# Patient Record
Sex: Female | Born: 1955 | Race: Black or African American | Hispanic: No | Marital: Single | Smoking: Current some day smoker
Health system: Southern US, Community
[De-identification: ages and names within clinical notes are randomized; demographics above are authoritative.]

## PROBLEM LIST (undated history)

## (undated) DIAGNOSIS — F419 Anxiety disorder, unspecified: Secondary | ICD-10-CM

## (undated) DIAGNOSIS — I10 Essential (primary) hypertension: Secondary | ICD-10-CM

## (undated) HISTORY — PX: CORONARY ANGIOPLASTY WITH STENT PLACEMENT: SHX49

## (undated) HISTORY — PX: BREAST CYST EXCISION: SHX579

## (undated) HISTORY — PX: ABDOMINAL HYSTERECTOMY: SHX81

---

## 2010-06-17 ENCOUNTER — Inpatient Hospital Stay (HOSPITAL_COMMUNITY)
Admission: AD | Admit: 2010-06-17 | Discharge: 2010-06-17 | Disposition: A | Discharge status: Home / Self Care | Payer: Medicare Other | Source: Ambulatory Visit | Attending: Obstetrics & Gynecology | Admitting: Obstetrics & Gynecology

## 2010-06-17 ENCOUNTER — Emergency Department (HOSPITAL_COMMUNITY)
Admission: EM | Admit: 2010-06-17 | Discharge: 2010-06-17 | Disposition: A | Discharge status: Home / Self Care | Payer: Medicare Other | Attending: Emergency Medicine | Admitting: Emergency Medicine

## 2010-06-17 ENCOUNTER — Emergency Department (HOSPITAL_COMMUNITY): Payer: Medicare Other

## 2010-06-17 DIAGNOSIS — R05 Cough: Secondary | ICD-10-CM | POA: Insufficient documentation

## 2010-06-17 DIAGNOSIS — R0789 Other chest pain: Secondary | ICD-10-CM | POA: Insufficient documentation

## 2010-06-17 DIAGNOSIS — R079 Chest pain, unspecified: Secondary | ICD-10-CM | POA: Insufficient documentation

## 2010-06-17 DIAGNOSIS — J45909 Unspecified asthma, uncomplicated: Secondary | ICD-10-CM | POA: Insufficient documentation

## 2010-06-17 DIAGNOSIS — E119 Type 2 diabetes mellitus without complications: Secondary | ICD-10-CM | POA: Insufficient documentation

## 2010-06-17 DIAGNOSIS — R059 Cough, unspecified: Secondary | ICD-10-CM | POA: Insufficient documentation

## 2010-06-17 DIAGNOSIS — I10 Essential (primary) hypertension: Secondary | ICD-10-CM | POA: Insufficient documentation

## 2010-06-17 LAB — DIFFERENTIAL
Basophils Absolute: 0 10*3/uL (ref 0.0–0.1)
Basophils Relative: 0 % (ref 0–1)
Lymphocytes Relative: 42 % (ref 12–46)
Monocytes Absolute: 0.3 10*3/uL (ref 0.1–1.0)
Neutro Abs: 2.4 10*3/uL (ref 1.7–7.7)
Neutrophils Relative %: 51 % (ref 43–77)

## 2010-06-17 LAB — COMPREHENSIVE METABOLIC PANEL
ALT: 14 U/L (ref 0–35)
AST: 14 U/L (ref 0–37)
Albumin: 3.5 g/dL (ref 3.5–5.2)
CO2: 27 mEq/L (ref 19–32)
Calcium: 8.8 mg/dL (ref 8.4–10.5)
Creatinine, Ser: 0.89 mg/dL (ref 0.4–1.2)
GFR calc Af Amer: >60 mL/min (ref >60)
GFR calc non Af Amer: >60 mL/min (ref >60)
Sodium: 139 mEq/L (ref 135–145)
Total Protein: 7.1 g/dL (ref 6.0–8.3)

## 2010-06-17 LAB — POCT CARDIAC MARKERS
CKMB, poc: <1 ng/mL — ABNORMAL LOW (ref 1.0–8.0)
Myoglobin, poc: 82.8 ng/mL (ref 12–200)
Troponin i, poc: <0.05 ng/mL (ref 0.00–0.09)

## 2010-06-17 LAB — CBC
HCT: 38.2 % (ref 36.0–46.0)
Hemoglobin: 12.6 g/dL (ref 12.0–15.0)
MCHC: 33 g/dL (ref 30.0–36.0)
RBC: 4.74 MIL/uL (ref 3.87–5.11)
WBC: 4.7 10*3/uL (ref 4.0–10.5)

## 2010-06-19 ENCOUNTER — Emergency Department (HOSPITAL_COMMUNITY)
Admission: EM | Admit: 2010-06-19 | Discharge: 2010-06-20 | Disposition: A | Discharge status: Home / Self Care | Payer: Medicare Other | Attending: Emergency Medicine | Admitting: Emergency Medicine

## 2010-06-19 ENCOUNTER — Emergency Department (HOSPITAL_COMMUNITY): Payer: Medicare Other

## 2010-06-19 DIAGNOSIS — R0989 Other specified symptoms and signs involving the circulatory and respiratory systems: Secondary | ICD-10-CM | POA: Insufficient documentation

## 2010-06-19 DIAGNOSIS — R0609 Other forms of dyspnea: Secondary | ICD-10-CM | POA: Insufficient documentation

## 2010-06-19 DIAGNOSIS — E119 Type 2 diabetes mellitus without complications: Secondary | ICD-10-CM | POA: Insufficient documentation

## 2010-06-19 DIAGNOSIS — R079 Chest pain, unspecified: Secondary | ICD-10-CM | POA: Insufficient documentation

## 2010-06-19 DIAGNOSIS — R110 Nausea: Secondary | ICD-10-CM | POA: Insufficient documentation

## 2010-06-19 DIAGNOSIS — R0602 Shortness of breath: Secondary | ICD-10-CM | POA: Insufficient documentation

## 2010-06-19 DIAGNOSIS — I10 Essential (primary) hypertension: Secondary | ICD-10-CM | POA: Insufficient documentation

## 2010-06-19 DIAGNOSIS — J45909 Unspecified asthma, uncomplicated: Secondary | ICD-10-CM | POA: Insufficient documentation

## 2010-06-19 LAB — DIFFERENTIAL
Basophils Absolute: 0 10*3/uL (ref 0.0–0.1)
Eosinophils Relative: 1 % (ref 0–5)
Lymphocytes Relative: 48 % — ABNORMAL HIGH (ref 12–46)
Lymphs Abs: 2.9 10*3/uL (ref 0.7–4.0)
Neutrophils Relative %: 45 % (ref 43–77)

## 2010-06-19 LAB — CBC
HCT: 37.5 % (ref 36.0–46.0)
Platelets: 257 10*3/uL (ref 150–400)
RBC: 4.68 MIL/uL (ref 3.87–5.11)
RDW: 13.5 % (ref 11.5–15.5)
WBC: 6.1 10*3/uL (ref 4.0–10.5)

## 2010-06-19 LAB — POCT CARDIAC MARKERS
CKMB, poc: <1 ng/mL — ABNORMAL LOW (ref 1.0–8.0)
Troponin i, poc: <0.05 ng/mL (ref 0.00–0.09)

## 2010-06-20 LAB — BASIC METABOLIC PANEL
Chloride: 102 mEq/L (ref 96–112)
GFR calc non Af Amer: >60 mL/min (ref >60)
Glucose, Bld: 256 mg/dL — ABNORMAL HIGH (ref 70–99)
Potassium: 3.4 mEq/L — ABNORMAL LOW (ref 3.5–5.1)
Sodium: 136 mEq/L (ref 135–145)

## 2010-06-20 LAB — D-DIMER, QUANTITATIVE: D-Dimer, Quant: 0.48 ug/mL-FEU (ref 0.00–0.48)

## 2010-06-21 ENCOUNTER — Emergency Department (HOSPITAL_COMMUNITY)
Admission: EM | Admit: 2010-06-21 | Discharge: 2010-06-22 | Disposition: A | Discharge status: Home / Self Care | Payer: Medicare Other | Attending: Emergency Medicine | Admitting: Emergency Medicine

## 2010-06-21 DIAGNOSIS — Z5900 Homelessness unspecified: Secondary | ICD-10-CM | POA: Insufficient documentation

## 2010-06-21 DIAGNOSIS — R079 Chest pain, unspecified: Secondary | ICD-10-CM | POA: Insufficient documentation

## 2010-06-21 DIAGNOSIS — I10 Essential (primary) hypertension: Secondary | ICD-10-CM | POA: Insufficient documentation

## 2010-06-21 DIAGNOSIS — E119 Type 2 diabetes mellitus without complications: Secondary | ICD-10-CM | POA: Insufficient documentation

## 2010-06-21 DIAGNOSIS — J45909 Unspecified asthma, uncomplicated: Secondary | ICD-10-CM | POA: Insufficient documentation

## 2010-06-22 ENCOUNTER — Emergency Department (HOSPITAL_COMMUNITY)
Admission: EM | Admit: 2010-06-22 | Discharge: 2010-06-22 | Disposition: A | Discharge status: Home / Self Care | Payer: Medicare Other | Attending: Emergency Medicine | Admitting: Emergency Medicine

## 2010-06-22 DIAGNOSIS — E119 Type 2 diabetes mellitus without complications: Secondary | ICD-10-CM | POA: Insufficient documentation

## 2010-06-22 DIAGNOSIS — R5381 Other malaise: Secondary | ICD-10-CM | POA: Insufficient documentation

## 2010-06-22 DIAGNOSIS — I10 Essential (primary) hypertension: Secondary | ICD-10-CM | POA: Insufficient documentation

## 2010-06-22 LAB — POCT CARDIAC MARKERS
CKMB, poc: <1 ng/mL — ABNORMAL LOW (ref 1.0–8.0)
CKMB, poc: <1 ng/mL — ABNORMAL LOW (ref 1.0–8.0)
Myoglobin, poc: 61.8 ng/mL (ref 12–200)
Troponin i, poc: <0.05 ng/mL (ref 0.00–0.09)
Troponin i, poc: <0.05 ng/mL (ref 0.00–0.09)

## 2010-06-22 LAB — URINALYSIS, ROUTINE W REFLEX MICROSCOPIC
Leukocytes, UA: NEGATIVE
Nitrite: NEGATIVE
Specific Gravity, Urine: 1.035 — ABNORMAL HIGH (ref 1.005–1.030)
Urobilinogen, UA: 0.2 mg/dL (ref 0.0–1.0)
pH: 5.5 (ref 5.0–8.0)

## 2010-06-22 LAB — BASIC METABOLIC PANEL
BUN: 7 mg/dL (ref 6–23)
GFR calc Af Amer: >60 mL/min (ref >60)
GFR calc non Af Amer: >60 mL/min (ref >60)
Potassium: 3.3 mEq/L — ABNORMAL LOW (ref 3.5–5.1)
Sodium: 139 mEq/L (ref 135–145)

## 2010-06-22 LAB — URINE MICROSCOPIC-ADD ON

## 2010-06-28 ENCOUNTER — Emergency Department (HOSPITAL_COMMUNITY)
Admission: EM | Admit: 2010-06-28 | Discharge: 2010-06-28 | Disposition: A | Discharge status: Home / Self Care | Payer: Medicare Other | Attending: Emergency Medicine | Admitting: Emergency Medicine

## 2010-06-28 DIAGNOSIS — I10 Essential (primary) hypertension: Secondary | ICD-10-CM | POA: Insufficient documentation

## 2010-06-28 DIAGNOSIS — K029 Dental caries, unspecified: Secondary | ICD-10-CM | POA: Insufficient documentation

## 2010-06-28 DIAGNOSIS — K089 Disorder of teeth and supporting structures, unspecified: Secondary | ICD-10-CM | POA: Insufficient documentation

## 2010-06-28 DIAGNOSIS — Z794 Long term (current) use of insulin: Secondary | ICD-10-CM | POA: Insufficient documentation

## 2010-06-28 DIAGNOSIS — E119 Type 2 diabetes mellitus without complications: Secondary | ICD-10-CM | POA: Insufficient documentation

## 2010-06-29 ENCOUNTER — Emergency Department (HOSPITAL_COMMUNITY): Payer: Medicare Other

## 2010-06-29 ENCOUNTER — Emergency Department (HOSPITAL_COMMUNITY)
Admission: EM | Admit: 2010-06-29 | Discharge: 2010-06-30 | Disposition: A | Discharge status: Home / Self Care | Payer: Medicare Other | Attending: Emergency Medicine | Admitting: Emergency Medicine

## 2010-06-29 DIAGNOSIS — Z794 Long term (current) use of insulin: Secondary | ICD-10-CM | POA: Insufficient documentation

## 2010-06-29 DIAGNOSIS — E119 Type 2 diabetes mellitus without complications: Secondary | ICD-10-CM | POA: Insufficient documentation

## 2010-06-29 DIAGNOSIS — I10 Essential (primary) hypertension: Secondary | ICD-10-CM | POA: Insufficient documentation

## 2010-06-29 DIAGNOSIS — J45909 Unspecified asthma, uncomplicated: Secondary | ICD-10-CM | POA: Insufficient documentation

## 2010-06-29 DIAGNOSIS — F17200 Nicotine dependence, unspecified, uncomplicated: Secondary | ICD-10-CM | POA: Insufficient documentation

## 2010-06-29 DIAGNOSIS — R071 Chest pain on breathing: Secondary | ICD-10-CM | POA: Insufficient documentation

## 2010-06-29 LAB — CBC
MCV: 81.4 fL (ref 78.0–100.0)
Platelets: 255 10*3/uL (ref 150–400)
RDW: 13.8 % (ref 11.5–15.5)
WBC: 6.1 10*3/uL (ref 4.0–10.5)

## 2010-06-29 LAB — DIFFERENTIAL
Basophils Relative: 0 % (ref 0–1)
Eosinophils Absolute: 0.1 10*3/uL (ref 0.0–0.7)
Eosinophils Relative: 2 % (ref 0–5)
Lymphs Abs: 3 10*3/uL (ref 0.7–4.0)

## 2010-06-30 ENCOUNTER — Inpatient Hospital Stay (HOSPITAL_COMMUNITY)
Admission: AD | Admit: 2010-06-30 | Discharge: 2010-06-30 | Disposition: A | Discharge status: Home / Self Care | Payer: Medicare Other | Source: Ambulatory Visit | Attending: Obstetrics & Gynecology | Admitting: Obstetrics & Gynecology

## 2010-06-30 DIAGNOSIS — N760 Acute vaginitis: Secondary | ICD-10-CM

## 2010-06-30 DIAGNOSIS — N949 Unspecified condition associated with female genital organs and menstrual cycle: Secondary | ICD-10-CM | POA: Insufficient documentation

## 2010-06-30 LAB — URINALYSIS, ROUTINE W REFLEX MICROSCOPIC
Bilirubin Urine: NEGATIVE
Ketones, ur: NEGATIVE mg/dL
Nitrite: NEGATIVE
Protein, ur: 100 mg/dL — AB
Urobilinogen, UA: 0.2 mg/dL (ref 0.0–1.0)

## 2010-06-30 LAB — WET PREP, GENITAL
Clue Cells Wet Prep HPF POC: NONE SEEN
Yeast Wet Prep HPF POC: NONE SEEN

## 2010-06-30 LAB — BASIC METABOLIC PANEL
BUN: 12 mg/dL (ref 6–23)
Chloride: 107 mEq/L (ref 96–112)
GFR calc non Af Amer: >60 mL/min (ref >60)
Glucose, Bld: 221 mg/dL — ABNORMAL HIGH (ref 70–99)
Potassium: 3.8 mEq/L (ref 3.5–5.1)

## 2010-06-30 LAB — POCT CARDIAC MARKERS
CKMB, poc: <1 ng/mL — ABNORMAL LOW (ref 1.0–8.0)
Troponin i, poc: <0.05 ng/mL (ref 0.00–0.09)

## 2010-07-01 ENCOUNTER — Inpatient Hospital Stay (HOSPITAL_COMMUNITY)
Admission: AD | Admit: 2010-07-01 | Discharge: 2010-07-02 | Disposition: A | Discharge status: Home / Self Care | Payer: Medicare Other | Source: Ambulatory Visit | Attending: Obstetrics & Gynecology | Admitting: Obstetrics & Gynecology

## 2010-07-01 DIAGNOSIS — H538 Other visual disturbances: Secondary | ICD-10-CM | POA: Insufficient documentation

## 2010-07-04 ENCOUNTER — Inpatient Hospital Stay (HOSPITAL_COMMUNITY)
Admission: AD | Admit: 2010-07-04 | Discharge: 2010-07-04 | Disposition: A | Discharge status: Home / Self Care | Payer: Medicare Other | Source: Ambulatory Visit | Attending: Obstetrics & Gynecology | Admitting: Obstetrics & Gynecology

## 2010-07-04 DIAGNOSIS — I10 Essential (primary) hypertension: Secondary | ICD-10-CM | POA: Insufficient documentation

## 2010-07-04 DIAGNOSIS — R109 Unspecified abdominal pain: Secondary | ICD-10-CM

## 2010-07-04 LAB — URINALYSIS, ROUTINE W REFLEX MICROSCOPIC
Bilirubin Urine: NEGATIVE
Glucose, UA: NEGATIVE mg/dL
Ketones, ur: NEGATIVE mg/dL
Leukocytes, UA: NEGATIVE
Nitrite: NEGATIVE
Protein, ur: 100 mg/dL — AB
Specific Gravity, Urine: >1.03 — ABNORMAL HIGH (ref 1.005–1.030)
Urobilinogen, UA: 0.2 mg/dL (ref 0.0–1.0)
pH: 6 (ref 5.0–8.0)

## 2010-07-04 LAB — URINE MICROSCOPIC-ADD ON

## 2010-07-05 ENCOUNTER — Emergency Department (HOSPITAL_BASED_OUTPATIENT_CLINIC_OR_DEPARTMENT_OTHER)
Admission: EM | Admit: 2010-07-05 | Discharge: 2010-07-05 | Disposition: A | Discharge status: Home / Self Care | Payer: Medicare Other | Attending: Emergency Medicine | Admitting: Emergency Medicine

## 2010-07-05 DIAGNOSIS — R109 Unspecified abdominal pain: Secondary | ICD-10-CM | POA: Insufficient documentation

## 2010-07-05 DIAGNOSIS — N200 Calculus of kidney: Secondary | ICD-10-CM | POA: Insufficient documentation

## 2010-07-06 ENCOUNTER — Emergency Department (HOSPITAL_COMMUNITY)
Admission: EM | Admit: 2010-07-06 | Discharge: 2010-07-06 | Disposition: A | Discharge status: Home / Self Care | Payer: Medicare Other | Attending: Emergency Medicine | Admitting: Emergency Medicine

## 2010-07-06 DIAGNOSIS — M2550 Pain in unspecified joint: Secondary | ICD-10-CM | POA: Insufficient documentation

## 2010-07-06 DIAGNOSIS — R112 Nausea with vomiting, unspecified: Secondary | ICD-10-CM | POA: Insufficient documentation

## 2010-07-06 DIAGNOSIS — R109 Unspecified abdominal pain: Secondary | ICD-10-CM | POA: Insufficient documentation

## 2010-07-06 DIAGNOSIS — Z91199 Patient's noncompliance with other medical treatment and regimen due to unspecified reason: Secondary | ICD-10-CM | POA: Insufficient documentation

## 2010-07-06 DIAGNOSIS — J45909 Unspecified asthma, uncomplicated: Secondary | ICD-10-CM | POA: Insufficient documentation

## 2010-07-06 DIAGNOSIS — IMO0001 Reserved for inherently not codable concepts without codable children: Secondary | ICD-10-CM | POA: Insufficient documentation

## 2010-07-06 DIAGNOSIS — Z9119 Patient's noncompliance with other medical treatment and regimen: Secondary | ICD-10-CM | POA: Insufficient documentation

## 2010-07-06 DIAGNOSIS — E119 Type 2 diabetes mellitus without complications: Secondary | ICD-10-CM | POA: Insufficient documentation

## 2010-07-06 DIAGNOSIS — I10 Essential (primary) hypertension: Secondary | ICD-10-CM | POA: Insufficient documentation

## 2010-07-06 DIAGNOSIS — E039 Hypothyroidism, unspecified: Secondary | ICD-10-CM | POA: Insufficient documentation

## 2010-07-06 LAB — URINALYSIS, ROUTINE W REFLEX MICROSCOPIC
Hgb urine dipstick: NEGATIVE
Leukocytes, UA: NEGATIVE
Nitrite: NEGATIVE
Specific Gravity, Urine: 1.027 (ref 1.005–1.030)
Urobilinogen, UA: 1 mg/dL (ref 0.0–1.0)

## 2010-07-06 LAB — POCT I-STAT, CHEM 8
Creatinine, Ser: 0.9 mg/dL (ref 0.4–1.2)
HCT: 34 % — ABNORMAL LOW (ref 36.0–46.0)
Hemoglobin: 11.6 g/dL — ABNORMAL LOW (ref 12.0–15.0)
Potassium: 3.9 mEq/L (ref 3.5–5.1)
Sodium: 140 mEq/L (ref 135–145)

## 2010-07-06 LAB — URINE MICROSCOPIC-ADD ON

## 2010-08-26 ENCOUNTER — Emergency Department (HOSPITAL_COMMUNITY)
Admission: EM | Admit: 2010-08-26 | Discharge: 2010-08-27 | Disposition: A | Discharge status: Home / Self Care | Payer: Medicare Other | Attending: Emergency Medicine | Admitting: Emergency Medicine

## 2010-08-26 DIAGNOSIS — R0602 Shortness of breath: Secondary | ICD-10-CM | POA: Insufficient documentation

## 2010-08-26 DIAGNOSIS — R609 Edema, unspecified: Secondary | ICD-10-CM | POA: Insufficient documentation

## 2010-08-26 DIAGNOSIS — R0789 Other chest pain: Secondary | ICD-10-CM | POA: Insufficient documentation

## 2010-08-26 DIAGNOSIS — Z794 Long term (current) use of insulin: Secondary | ICD-10-CM | POA: Insufficient documentation

## 2010-08-26 DIAGNOSIS — Z5900 Homelessness unspecified: Secondary | ICD-10-CM | POA: Insufficient documentation

## 2010-08-26 DIAGNOSIS — E119 Type 2 diabetes mellitus without complications: Secondary | ICD-10-CM | POA: Insufficient documentation

## 2010-08-26 DIAGNOSIS — R0609 Other forms of dyspnea: Secondary | ICD-10-CM | POA: Insufficient documentation

## 2010-08-26 DIAGNOSIS — R0989 Other specified symptoms and signs involving the circulatory and respiratory systems: Secondary | ICD-10-CM | POA: Insufficient documentation

## 2010-08-26 DIAGNOSIS — I10 Essential (primary) hypertension: Secondary | ICD-10-CM | POA: Insufficient documentation

## 2010-08-27 ENCOUNTER — Emergency Department (HOSPITAL_COMMUNITY): Payer: Medicare Other

## 2010-08-27 LAB — URINALYSIS, ROUTINE W REFLEX MICROSCOPIC
Bilirubin Urine: NEGATIVE
Ketones, ur: NEGATIVE mg/dL
Protein, ur: >300 mg/dL — AB
Urobilinogen, UA: 1 mg/dL (ref 0.0–1.0)

## 2010-08-27 LAB — URINE MICROSCOPIC-ADD ON

## 2010-08-27 LAB — DIFFERENTIAL
Basophils Absolute: 0 10*3/uL (ref 0.0–0.1)
Basophils Relative: 0 % (ref 0–1)
Eosinophils Absolute: 0.1 10*3/uL (ref 0.0–0.7)
Eosinophils Relative: 1 % (ref 0–5)
Lymphs Abs: 2.7 10*3/uL (ref 0.7–4.0)
Neutrophils Relative %: 55 % (ref 43–77)

## 2010-08-27 LAB — COMPREHENSIVE METABOLIC PANEL
AST: 16 U/L (ref 0–37)
Albumin: 3.3 g/dL — ABNORMAL LOW (ref 3.5–5.2)
Alkaline Phosphatase: 139 U/L — ABNORMAL HIGH (ref 39–117)
BUN: 12 mg/dL (ref 6–23)
GFR calc Af Amer: >60 mL/min (ref >60)
Potassium: 3.8 mEq/L (ref 3.5–5.1)
Total Protein: 7.3 g/dL (ref 6.0–8.3)

## 2010-08-27 LAB — CBC
MCV: 79.5 fL (ref 78.0–100.0)
Platelets: 242 10*3/uL (ref 150–400)
RDW: 13.5 % (ref 11.5–15.5)
WBC: 7 10*3/uL (ref 4.0–10.5)

## 2010-08-27 LAB — GLUCOSE, CAPILLARY: Glucose-Capillary: 203 mg/dL — ABNORMAL HIGH (ref 70–99)

## 2010-08-28 ENCOUNTER — Emergency Department (HOSPITAL_BASED_OUTPATIENT_CLINIC_OR_DEPARTMENT_OTHER)
Admission: EM | Admit: 2010-08-28 | Discharge: 2010-08-28 | Disposition: A | Discharge status: Home / Self Care | Payer: Medicare Other | Attending: Emergency Medicine | Admitting: Emergency Medicine

## 2010-08-28 DIAGNOSIS — R059 Cough, unspecified: Secondary | ICD-10-CM | POA: Insufficient documentation

## 2010-08-28 DIAGNOSIS — F17200 Nicotine dependence, unspecified, uncomplicated: Secondary | ICD-10-CM | POA: Insufficient documentation

## 2010-08-28 DIAGNOSIS — R05 Cough: Secondary | ICD-10-CM | POA: Insufficient documentation

## 2010-08-28 DIAGNOSIS — I10 Essential (primary) hypertension: Secondary | ICD-10-CM | POA: Insufficient documentation

## 2010-08-28 DIAGNOSIS — E119 Type 2 diabetes mellitus without complications: Secondary | ICD-10-CM | POA: Insufficient documentation

## 2010-08-28 DIAGNOSIS — J45909 Unspecified asthma, uncomplicated: Secondary | ICD-10-CM | POA: Insufficient documentation

## 2010-08-28 DIAGNOSIS — E039 Hypothyroidism, unspecified: Secondary | ICD-10-CM | POA: Insufficient documentation

## 2010-09-02 ENCOUNTER — Emergency Department (INDEPENDENT_AMBULATORY_CARE_PROVIDER_SITE_OTHER): Payer: Medicare Other

## 2010-09-02 ENCOUNTER — Emergency Department (HOSPITAL_BASED_OUTPATIENT_CLINIC_OR_DEPARTMENT_OTHER)
Admission: EM | Admit: 2010-09-02 | Discharge: 2010-09-03 | Disposition: A | Discharge status: Home / Self Care | Payer: Medicare Other | Attending: Emergency Medicine | Admitting: Emergency Medicine

## 2010-09-02 DIAGNOSIS — I10 Essential (primary) hypertension: Secondary | ICD-10-CM | POA: Insufficient documentation

## 2010-09-02 DIAGNOSIS — M7989 Other specified soft tissue disorders: Secondary | ICD-10-CM

## 2010-09-02 DIAGNOSIS — J45909 Unspecified asthma, uncomplicated: Secondary | ICD-10-CM | POA: Insufficient documentation

## 2010-09-02 DIAGNOSIS — E1169 Type 2 diabetes mellitus with other specified complication: Secondary | ICD-10-CM | POA: Insufficient documentation

## 2010-09-02 DIAGNOSIS — E039 Hypothyroidism, unspecified: Secondary | ICD-10-CM | POA: Insufficient documentation

## 2010-09-02 DIAGNOSIS — F17200 Nicotine dependence, unspecified, uncomplicated: Secondary | ICD-10-CM | POA: Insufficient documentation

## 2010-09-02 DIAGNOSIS — R079 Chest pain, unspecified: Secondary | ICD-10-CM

## 2010-09-02 DIAGNOSIS — Z79899 Other long term (current) drug therapy: Secondary | ICD-10-CM | POA: Insufficient documentation

## 2010-09-02 LAB — URINALYSIS, ROUTINE W REFLEX MICROSCOPIC
Bilirubin Urine: NEGATIVE
Ketones, ur: NEGATIVE mg/dL
Protein, ur: >300 mg/dL — AB
Urobilinogen, UA: 1 mg/dL (ref 0.0–1.0)

## 2010-09-02 LAB — GLUCOSE, CAPILLARY
Glucose-Capillary: 288 mg/dL — ABNORMAL HIGH (ref 70–99)
Glucose-Capillary: 312 mg/dL — ABNORMAL HIGH (ref 70–99)

## 2010-09-02 LAB — URINE MICROSCOPIC-ADD ON

## 2010-09-02 LAB — BASIC METABOLIC PANEL
Calcium: 9.6 mg/dL (ref 8.4–10.5)
Creatinine, Ser: 0.8 mg/dL (ref 0.4–1.2)
GFR calc Af Amer: >60 mL/min (ref >60)

## 2010-09-03 ENCOUNTER — Inpatient Hospital Stay (HOSPITAL_COMMUNITY)
Admission: EM | Admit: 2010-09-03 | Discharge: 2010-09-03 | Disposition: A | Discharge status: Home / Self Care | Payer: Medicare Other | Source: Ambulatory Visit | Attending: Family Medicine | Admitting: Family Medicine

## 2010-09-03 DIAGNOSIS — B373 Candidiasis of vulva and vagina: Secondary | ICD-10-CM

## 2010-09-03 DIAGNOSIS — B3731 Acute candidiasis of vulva and vagina: Secondary | ICD-10-CM | POA: Insufficient documentation

## 2010-09-03 DIAGNOSIS — N949 Unspecified condition associated with female genital organs and menstrual cycle: Secondary | ICD-10-CM

## 2010-09-03 LAB — CBC
Hemoglobin: 12.1 g/dL (ref 12.0–15.0)
MCV: 79.7 fL (ref 78.0–100.0)
Platelets: 259 10*3/uL (ref 150–400)
RBC: 4.53 MIL/uL (ref 3.87–5.11)
WBC: 6.1 10*3/uL (ref 4.0–10.5)

## 2010-09-03 LAB — URINALYSIS, ROUTINE W REFLEX MICROSCOPIC
Bilirubin Urine: NEGATIVE
Ketones, ur: NEGATIVE mg/dL
Nitrite: NEGATIVE
Protein, ur: 100 mg/dL — AB
Urobilinogen, UA: 1 mg/dL (ref 0.0–1.0)
pH: 6 (ref 5.0–8.0)

## 2010-09-03 LAB — GLUCOSE, CAPILLARY: Glucose-Capillary: 189 mg/dL — ABNORMAL HIGH (ref 70–99)

## 2010-09-03 LAB — URINE MICROSCOPIC-ADD ON

## 2010-09-03 LAB — WET PREP, GENITAL

## 2010-10-24 ENCOUNTER — Emergency Department (HOSPITAL_COMMUNITY)
Admission: EM | Admit: 2010-10-24 | Discharge: 2010-10-24 | Disposition: A | Discharge status: Home / Self Care | Payer: Medicare Other | Attending: Emergency Medicine | Admitting: Emergency Medicine

## 2010-10-24 DIAGNOSIS — I10 Essential (primary) hypertension: Secondary | ICD-10-CM | POA: Insufficient documentation

## 2010-10-24 DIAGNOSIS — R109 Unspecified abdominal pain: Secondary | ICD-10-CM | POA: Insufficient documentation

## 2010-10-24 DIAGNOSIS — J45909 Unspecified asthma, uncomplicated: Secondary | ICD-10-CM | POA: Insufficient documentation

## 2010-10-24 DIAGNOSIS — E119 Type 2 diabetes mellitus without complications: Secondary | ICD-10-CM | POA: Insufficient documentation

## 2010-10-24 DIAGNOSIS — R110 Nausea: Secondary | ICD-10-CM | POA: Insufficient documentation

## 2010-10-24 DIAGNOSIS — E039 Hypothyroidism, unspecified: Secondary | ICD-10-CM | POA: Insufficient documentation

## 2010-10-29 ENCOUNTER — Emergency Department (HOSPITAL_COMMUNITY)
Admission: EM | Admit: 2010-10-29 | Discharge: 2010-10-29 | Disposition: A | Discharge status: Home / Self Care | Payer: Medicare Other | Attending: Emergency Medicine | Admitting: Emergency Medicine

## 2010-10-29 DIAGNOSIS — E039 Hypothyroidism, unspecified: Secondary | ICD-10-CM | POA: Insufficient documentation

## 2010-10-29 DIAGNOSIS — Z794 Long term (current) use of insulin: Secondary | ICD-10-CM | POA: Insufficient documentation

## 2010-10-29 DIAGNOSIS — R1012 Left upper quadrant pain: Secondary | ICD-10-CM | POA: Insufficient documentation

## 2010-10-29 DIAGNOSIS — R112 Nausea with vomiting, unspecified: Secondary | ICD-10-CM | POA: Insufficient documentation

## 2010-10-29 DIAGNOSIS — R197 Diarrhea, unspecified: Secondary | ICD-10-CM | POA: Insufficient documentation

## 2010-10-29 DIAGNOSIS — Z79899 Other long term (current) drug therapy: Secondary | ICD-10-CM | POA: Insufficient documentation

## 2010-10-29 DIAGNOSIS — I10 Essential (primary) hypertension: Secondary | ICD-10-CM | POA: Insufficient documentation

## 2010-10-29 DIAGNOSIS — E119 Type 2 diabetes mellitus without complications: Secondary | ICD-10-CM | POA: Insufficient documentation

## 2010-10-29 DIAGNOSIS — R300 Dysuria: Secondary | ICD-10-CM | POA: Insufficient documentation

## 2010-10-29 DIAGNOSIS — J45909 Unspecified asthma, uncomplicated: Secondary | ICD-10-CM | POA: Insufficient documentation

## 2010-10-29 LAB — POCT I-STAT, CHEM 8
Creatinine, Ser: 1 mg/dL (ref 0.50–1.10)
HCT: 42 % (ref 36.0–46.0)
Hemoglobin: 14.3 g/dL (ref 12.0–15.0)
Potassium: 3.6 mEq/L (ref 3.5–5.1)
Sodium: 144 mEq/L (ref 135–145)

## 2010-10-29 LAB — DIFFERENTIAL
Basophils Relative: 0 % (ref 0–1)
Eosinophils Absolute: 0.1 10*3/uL (ref 0.0–0.7)
Eosinophils Relative: 2 % (ref 0–5)
Monocytes Absolute: 0.3 10*3/uL (ref 0.1–1.0)
Monocytes Relative: 6 % (ref 3–12)

## 2010-10-29 LAB — URINALYSIS, ROUTINE W REFLEX MICROSCOPIC
Nitrite: NEGATIVE
Specific Gravity, Urine: 1.028 (ref 1.005–1.030)
Urobilinogen, UA: 1 mg/dL (ref 0.0–1.0)

## 2010-10-29 LAB — HEPATIC FUNCTION PANEL
Alkaline Phosphatase: 96 U/L (ref 39–117)
Total Protein: 7.8 g/dL (ref 6.0–8.3)

## 2010-10-29 LAB — LIPASE, BLOOD: Lipase: 22 U/L (ref 11–59)

## 2010-10-29 LAB — CBC
MCH: 26.1 pg (ref 26.0–34.0)
MCHC: 32.8 g/dL (ref 30.0–36.0)
Platelets: 304 10*3/uL (ref 150–400)

## 2010-10-29 LAB — URINE MICROSCOPIC-ADD ON

## 2011-04-28 ENCOUNTER — Encounter (HOSPITAL_COMMUNITY): Payer: Self-pay | Admitting: Family Medicine

## 2011-04-28 ENCOUNTER — Emergency Department (HOSPITAL_COMMUNITY): Payer: Medicare Other

## 2011-04-28 ENCOUNTER — Emergency Department (HOSPITAL_COMMUNITY)
Admission: EM | Admit: 2011-04-28 | Discharge: 2011-04-28 | Disposition: A | Discharge status: Home / Self Care | Payer: Medicare Other | Attending: Emergency Medicine | Admitting: Emergency Medicine

## 2011-04-28 ENCOUNTER — Other Ambulatory Visit: Payer: Self-pay

## 2011-04-28 ENCOUNTER — Observation Stay (HOSPITAL_COMMUNITY)
Admission: EM | Admit: 2011-04-28 | Discharge: 2011-04-30 | Disposition: A | Discharge status: Home / Self Care | Payer: Medicare Other | Attending: Internal Medicine | Admitting: Internal Medicine

## 2011-04-28 ENCOUNTER — Encounter (HOSPITAL_COMMUNITY): Payer: Self-pay

## 2011-04-28 DIAGNOSIS — Z79899 Other long term (current) drug therapy: Secondary | ICD-10-CM | POA: Insufficient documentation

## 2011-04-28 DIAGNOSIS — Z794 Long term (current) use of insulin: Secondary | ICD-10-CM | POA: Insufficient documentation

## 2011-04-28 DIAGNOSIS — Z5900 Homelessness unspecified: Secondary | ICD-10-CM

## 2011-04-28 DIAGNOSIS — R0789 Other chest pain: Secondary | ICD-10-CM

## 2011-04-28 DIAGNOSIS — E119 Type 2 diabetes mellitus without complications: Secondary | ICD-10-CM | POA: Diagnosis present

## 2011-04-28 DIAGNOSIS — I10 Essential (primary) hypertension: Secondary | ICD-10-CM | POA: Insufficient documentation

## 2011-04-28 DIAGNOSIS — R071 Chest pain on breathing: Secondary | ICD-10-CM | POA: Insufficient documentation

## 2011-04-28 DIAGNOSIS — J45909 Unspecified asthma, uncomplicated: Secondary | ICD-10-CM | POA: Insufficient documentation

## 2011-04-28 HISTORY — DX: Essential (primary) hypertension: I10

## 2011-04-28 LAB — CBC
HCT: 39.6 % (ref 36.0–46.0)
Hemoglobin: 13.4 g/dL (ref 12.0–15.0)
MCH: 27.3 pg (ref 26.0–34.0)
MCHC: 33.8 g/dL (ref 30.0–36.0)
MCV: 80.7 fL (ref 78.0–100.0)
Platelets: 280 10*3/uL (ref 150–400)
RBC: 4.91 MIL/uL (ref 3.87–5.11)
RDW: 13 % (ref 11.5–15.5)
WBC: 5.3 10*3/uL (ref 4.0–10.5)

## 2011-04-28 LAB — BASIC METABOLIC PANEL
BUN: 11 mg/dL (ref 6–23)
CO2: 26 mEq/L (ref 19–32)
Calcium: 9 mg/dL (ref 8.4–10.5)
Chloride: 100 mEq/L (ref 96–112)
Creatinine, Ser: 0.71 mg/dL (ref 0.50–1.10)
GFR calc Af Amer: >90 mL/min (ref >90)
GFR calc non Af Amer: >90 mL/min (ref >90)
Glucose, Bld: 260 mg/dL — ABNORMAL HIGH (ref 70–99)
Potassium: 3.4 mEq/L — ABNORMAL LOW (ref 3.5–5.1)
Sodium: 136 mEq/L (ref 135–145)

## 2011-04-28 LAB — D-DIMER, QUANTITATIVE (NOT AT ARMC): D-Dimer, Quant: 0.25 ug/mL-FEU (ref 0.00–0.48)

## 2011-04-28 LAB — CARDIAC PANEL(CRET KIN+CKTOT+MB+TROPI)
CK, MB: 2.5 ng/mL (ref 0.3–4.0)
Relative Index: 1.5 (ref 0.0–2.5)
Total CK: 170 U/L (ref 7–177)
Troponin I: <0.3 ng/mL (ref <0.30)

## 2011-04-28 LAB — TROPONIN I: Troponin I: <0.3 ng/mL (ref <0.30)

## 2011-04-28 LAB — POCT I-STAT, CHEM 8
Chloride: 105 mEq/L (ref 96–112)
Creatinine, Ser: 1 mg/dL (ref 0.50–1.10)
Glucose, Bld: 308 mg/dL — ABNORMAL HIGH (ref 70–99)
Potassium: 3.7 mEq/L (ref 3.5–5.1)
Sodium: 139 mEq/L (ref 135–145)

## 2011-04-28 MED ORDER — DIAZEPAM 5 MG PO TABS
5.0000 mg | ORAL_TABLET | Freq: Once | ORAL | Status: AC
  Administered 2011-04-28: 5 mg via ORAL
  Filled 2011-04-28: qty 1

## 2011-04-28 MED ORDER — HYDROCODONE-ACETAMINOPHEN 5-325 MG PO TABS: 1.0000 | ORAL_TABLET | Freq: Four times a day (QID) | ORAL | Status: DC | PRN

## 2011-04-28 MED ORDER — KETOROLAC TROMETHAMINE 60 MG/2ML IM SOLN
60.0000 mg | Freq: Once | INTRAMUSCULAR | Status: AC
  Administered 2011-04-28: 60 mg via INTRAMUSCULAR
  Filled 2011-04-28: qty 2

## 2011-04-28 MED ORDER — IBUPROFEN 800 MG PO TABS: 800.0000 mg | ORAL_TABLET | Freq: Three times a day (TID) | ORAL | Status: DC | PRN

## 2011-04-28 MED ORDER — OXYCODONE-ACETAMINOPHEN 5-325 MG PO TABS
1.0000 | ORAL_TABLET | Freq: Once | ORAL | Status: AC
  Administered 2011-04-28: 1 via ORAL
  Filled 2011-04-28: qty 1

## 2011-04-28 NOTE — ED Provider Notes (Signed)
History     CSN: [id number]  Arrival date & time 04/28/11  1243   First MD Initiated Contact with Patient 04/28/11 1252      Chief Complaint  Patient presents with  . Chest Pain    (Consider location/radiation/quality/duration/timing/severity/associated sxs/prior treatment) HPI Patient presents to emergency room with chest pain that started today around 11:00 AM.  She states it has been constant and nonradiating.  She states it is sharp in nature and hurts to press or move.  She denies abdominal pain, nausea/vomiting, weakness, visual changes, headache,sob, or back pain.  Patient's pain is worse with movement.  Past Medical History  Diagnosis Date  . Asthma   . Hypertension   . Diabetes mellitus     Past Surgical History  Procedure Date  . Abdominal hysterectomy   . Breast cyst excision   . Coronary angioplasty with stent placement     No family history on file.  History  Substance Use Topics  . Smoking status: Passive Smoker  . Smokeless tobacco: Not on file  . Alcohol Use: Yes     beer sometimes    OB History    Grav Para Term Preterm Abortions TAB SAB Ect Mult Living                  Review of Systems All pertinent positives and negatives reviewed in the history of present illness  Allergies  Codeine  Home Medications   Current Outpatient Rx  Name Route Sig Dispense Refill  . INSULIN ISOPHANE & REGULAR (70-30) 100 UNIT/ML [place] SUSP Subcutaneous Inject 10-15 Units into the skin 2 (two) times daily with a meal. Use 15 units in AM and 10 units in PM      BP 180/78  Pulse 64  Temp(Src) 97.9 F (36.6 C) (Oral)  Resp 20  Ht 5\' 6"  (1.676 m)  Wt 165 lb (74.844 kg)  BMI 26.63 kg/m2  SpO2 98%  Physical Exam  Vitals reviewed. Constitutional: She is oriented to person, place, and time. She appears well-developed and well-nourished.  HENT:  Head: Normocephalic and atraumatic.  Mouth/Throat: No oropharyngeal exudate.  Eyes: Pupils are equal, round, and  reactive to light.  Neck: Normal range of motion. Neck supple.  Cardiovascular: Normal rate, regular rhythm and normal heart sounds.  Exam reveals no gallop and no friction rub.   No murmur heard. Pulmonary/Chest: Effort normal and breath sounds normal. No respiratory distress. She has no wheezes. She has no rales. She exhibits tenderness.  Neurological: She is alert and oriented to person, place, and time.  Skin: Skin is warm and dry.  Psychiatric: She has a normal mood and affect. Her behavior is normal.    ED Course  Procedures (including critical care time)  Labs Reviewed  BASIC METABOLIC PANEL - Abnormal; Notable for the following:    Potassium 3.4 (*)    Glucose, Bld 260 (*)    All other components within normal limits  CBC  CARDIAC PANEL(CRET KIN+CKTOT+MB+TROPI)   Dg Chest 2 View  04/28/2011  *RADIOLOGY REPORT*  Clinical Data: Chest pain.  CHEST - 2 VIEW  Comparison: 09/02/2010  Findings: Airway thickening may reflect bronchitis or reactive airways disease.  No airspace opacity is identified to suggest bacterial pneumonia pattern.  Borderline cardiomegaly noted without edema.  No pleural effusion is observed.  IMPRESSION:  1. Airway thickening may reflect bronchitis or reactive airways disease.  No airspace opacity is identified to suggest bacterial pneumonia pattern. 2.  Borderline cardiomegaly.  Original Report Authenticated By: Dellia Cloud, M.D.     Patient is low risk based on Wells criteria and therefore got a d-dimer as a confirmatory of her low risk nature of its negative.  Patient has atypical pain for cardiac disease based on her age and physical exam findings.  The nursing note mentioned shortness of breath the patient states that painful when she takes a deep breath.  The patient has been seen in the past for this similar type pain.  She had negative workups each of those times.  The patient states she may have had stenting in the past but that is unclear so the  nursing note stating she has had angioplasty with stenting seems a bit unclear.  Patient is improved following pain medications.  She is told to return here for any worsening in her condition    MDM   Date: 04/28/2011  Rate: 77  Rhythm: normal sinus rhythm  QRS Axis: normal  Intervals: normal  ST/T Wave abnormalities: normal  Conduction Disutrbances:none  Narrative Interpretation:   Old EKG Reviewed: unchanged         Carlyle Dolly, PA-C 04/28/11 1659

## 2011-04-28 NOTE — ED Provider Notes (Signed)
History     CSN: [id number]  Arrival date & time 04/28/11  2111   First MD Initiated Contact with Patient 04/28/11 2126      No chief complaint on file.   (Consider location/radiation/quality/duration/timing/severity/associated sxs/prior treatment) HPI.... second visit to emergency department today for chest pain. Patient is homeless and has no primary care Dr. She ran out of her medicine for blood pressure and diabetes.  Pain is vague in nature.  No dyspnea, diaphoresis, nausea. Nothing makes it better or worse. Pain is minimal  Past Medical History  Diagnosis Date  . Asthma   . Hypertension   . Diabetes mellitus     Past Surgical History  Procedure Date  . Abdominal hysterectomy   . Breast cyst excision   . Coronary angioplasty with stent placement     No family history on file.  History  Substance Use Topics  . Smoking status: Passive Smoker  . Smokeless tobacco: Not on file  . Alcohol Use: Yes     beer sometimes    OB History    Grav Para Term Preterm Abortions TAB SAB Ect Mult Living                  Review of Systems  All other systems reviewed and are negative.    Allergies  Codeine  Home Medications   Current Outpatient Rx  Name Route Sig Dispense Refill  . HYDROCODONE-ACETAMINOPHEN 5-325 MG PO TABS Oral Take 1 tablet by mouth every 6 (six) hours as needed for pain. 15 tablet 0  . IBUPROFEN 800 MG PO TABS Oral Take 1 tablet (800 mg total) by mouth every 8 (eight) hours as needed for pain. 21 tablet 0  . INSULIN ISOPHANE & REGULAR (70-30) 100 UNIT/ML [place] SUSP Subcutaneous Inject 10-15 Units into the skin 2 (two) times daily with a meal. Use 15 units in AM and 10 units in PM      BP 174/81  Pulse 79  Temp(Src) 98.3 F (36.8 C) (Oral)  Resp 20  SpO2 99%  Physical Exam  Nursing note and vitals reviewed. Constitutional: She is oriented to person, place, and time. She appears well-developed and well-nourished.       Hypertensive  HENT:  Head:  Normocephalic and atraumatic.  Eyes: Conjunctivae and EOM are normal. Pupils are equal, round, and reactive to light.  Neck: Normal range of motion. Neck supple.  Cardiovascular: Normal rate and regular rhythm.   Pulmonary/Chest: Effort normal and breath sounds normal.  Abdominal: Soft. Bowel sounds are normal.  Musculoskeletal: Normal range of motion.  Neurological: She is alert and oriented to person, place, and time.  Skin: Skin is warm and dry.  Psychiatric: She has a normal mood and affect.    ED Course  Procedures (including critical care time)  Labs Reviewed  POCT I-STAT, CHEM 8 - Abnormal; Notable for the following:    Glucose, Bld 308 (*)    Calcium, Ion 1.11 (*)    Hemoglobin 15.6 (*)    All other components within normal limits  CARDIAC PANEL(CRET KIN+CKTOT+MB+TROPI)  I-STAT, CHEM 8   Dg Chest 2 View  04/28/2011  *RADIOLOGY REPORT*  Clinical Data: Chest pain.  CHEST - 2 VIEW  Comparison: 09/02/2010  Findings: Airway thickening may reflect bronchitis or reactive airways disease.  No airspace opacity is identified to suggest bacterial pneumonia pattern.  Borderline cardiomegaly noted without edema.  No pleural effusion is observed.  IMPRESSION:  1. Airway thickening may reflect bronchitis or  reactive airways disease.  No airspace opacity is identified to suggest bacterial pneumonia pattern. 2.  Borderline cardiomegaly.  Original Report Authenticated By: Dellia Cloud, M.D.   Dg Chest Port 1 View  04/28/2011  *RADIOLOGY REPORT*  Clinical Data: Chest pain.  PORTABLE CHEST - 1 VIEW  Comparison: Chest radiograph performed earlier today at 01:40 p.m.  Findings: The lungs are hypoexpanded.  Chronically increased interstitial markings are noted.  There is no evidence of focal opacification, pleural effusion or pneumothorax.  The cardiomediastinal silhouette is borderline normal in size.  No acute osseous abnormalities are seen.  IMPRESSION: Lungs hypoexpanded; mild chronic lung  changes seen.  No acute cardiopulmonary process identified.  Original Report Authenticated By: Tonia Ghent, M.D.     No diagnosis found.  Date: 04/28/2011  Rate: 77  Rhythm: normal sinus rhythm  QRS Axis: normal  Intervals: normal  ST/T Wave abnormalities: normal  Conduction Disutrbances:none  Narrative Interpretation:   Old EKG Reviewed: unchanged    MDM  Second visit to ED within the past 10 hours.  Patient has no money for prescriptions for diabetes and high blood pressure.  Will admit to observation and get social services involved        Donnetta Hutching, MD 04/29/11 [phone]

## 2011-04-28 NOTE — ED Notes (Signed)
CSW provided pt and her friend with bus passes.

## 2011-04-28 NOTE — ED Notes (Signed)
Patient states that she had been staying at the [facility]. States that she is not able to return to the [facility] "because I ran out of money."

## 2011-04-28 NOTE — ED Notes (Signed)
Social Worker paged for pt.

## 2011-04-28 NOTE — ED Notes (Signed)
Patient also states that she has not had her blood pressure medicine in "about 3 weeks." States that she ran out.

## 2011-04-28 NOTE — ED Notes (Signed)
Per EMS patient was picked up at a motel c/o sharp pressure to the left chest, non-radiating pain. Also c/o SOB. Administered 324 mg ASA, NSR on 12 lead EKG, has not taken her medications in 1 month. sts she remembers having something run up through her groin but not sure if she has a stent in or not.

## 2011-04-28 NOTE — ED Notes (Signed)
Per EMS, patient was released from [facility] this afternoon for same complaint. Presents tonite c/o chest pain with inspiration, palpation and movement.

## 2011-04-28 NOTE — ED Notes (Signed)
Report given to Onalee Hua, EMT

## 2011-04-28 NOTE — ED Notes (Signed)
EKGs handed to [place], MD 

## 2011-04-29 ENCOUNTER — Encounter (HOSPITAL_COMMUNITY): Payer: Self-pay | Admitting: Internal Medicine

## 2011-04-29 DIAGNOSIS — R0789 Other chest pain: Secondary | ICD-10-CM | POA: Diagnosis present

## 2011-04-29 DIAGNOSIS — E119 Type 2 diabetes mellitus without complications: Secondary | ICD-10-CM | POA: Diagnosis present

## 2011-04-29 DIAGNOSIS — Z5900 Homelessness unspecified: Secondary | ICD-10-CM

## 2011-04-29 DIAGNOSIS — I10 Essential (primary) hypertension: Secondary | ICD-10-CM | POA: Diagnosis present

## 2011-04-29 LAB — GLUCOSE, CAPILLARY
Glucose-Capillary: 249 mg/dL — ABNORMAL HIGH (ref 70–99)
Glucose-Capillary: 280 mg/dL — ABNORMAL HIGH (ref 70–99)
Glucose-Capillary: 166 mg/dL — ABNORMAL HIGH (ref 70–99)

## 2011-04-29 LAB — CARDIAC PANEL(CRET KIN+CKTOT+MB+TROPI): CK, MB: 2.5 ng/mL (ref 0.3–4.0)

## 2011-04-29 LAB — HEMOGLOBIN A1C: Mean Plasma Glucose: 220 mg/dL — ABNORMAL HIGH (ref <117)

## 2011-04-29 MED ORDER — ALPRAZOLAM 0.5 MG PO TABS
0.5000 mg | ORAL_TABLET | Freq: Once | ORAL | Status: AC
  Administered 2011-04-29: 0.5 mg via ORAL

## 2011-04-29 MED ORDER — GI COCKTAIL ~~LOC~~
30.0000 mL | Freq: Three times a day (TID) | ORAL | Status: DC | PRN
  Administered 2011-04-29: 30 mL via ORAL
  Filled 2011-04-29 (×2): qty 30

## 2011-04-29 MED ORDER — DOCUSATE SODIUM 100 MG PO CAPS
100.0000 mg | ORAL_CAPSULE | Freq: Two times a day (BID) | ORAL | Status: DC
  Administered 2011-04-29 – 2011-04-30 (×3): 100 mg via ORAL
  Filled 2011-04-29 (×6): qty 1

## 2011-04-29 MED ORDER — ALBUTEROL SULFATE HFA 108 (90 BASE) MCG/ACT IN AERS
2.0000 | INHALATION_SPRAY | Freq: Four times a day (QID) | RESPIRATORY_TRACT | Status: DC | PRN
  Administered 2011-04-30: 2 via RESPIRATORY_TRACT
  Filled 2011-04-29: qty 6.7

## 2011-04-29 MED ORDER — ACETAMINOPHEN 650 MG RE SUPP
650.0000 mg | Freq: Three times a day (TID) | RECTAL | Status: DC
  Filled 2011-04-29 (×6): qty 1

## 2011-04-29 MED ORDER — ACETAMINOPHEN 500 MG PO TABS
1000.0000 mg | ORAL_TABLET | Freq: Once | ORAL | Status: AC
  Administered 2011-04-29: 1000 mg via ORAL
  Filled 2011-04-29: qty 2

## 2011-04-29 MED ORDER — IBUPROFEN 800 MG PO TABS
800.0000 mg | ORAL_TABLET | Freq: Three times a day (TID) | ORAL | Status: DC
  Administered 2011-04-29 – 2011-04-30 (×5): 800 mg via ORAL
  Filled 2011-04-29 (×9): qty 1

## 2011-04-29 MED ORDER — ALUM & MAG HYDROXIDE-SIMETH 200-200-20 MG/5ML PO SUSP
30.0000 mL | Freq: Four times a day (QID) | ORAL | Status: DC | PRN
  Administered 2011-04-29: 30 mL via ORAL

## 2011-04-29 MED ORDER — ONDANSETRON HCL 4 MG/2ML IJ SOLN: 4.0000 mg | Freq: Four times a day (QID) | INTRAMUSCULAR | Status: DC | PRN

## 2011-04-29 MED ORDER — ONDANSETRON HCL 4 MG PO TABS: 4.0000 mg | ORAL_TABLET | Freq: Four times a day (QID) | ORAL | Status: DC | PRN

## 2011-04-29 MED ORDER — ASPIRIN EC 81 MG PO TBEC
81.0000 mg | DELAYED_RELEASE_TABLET | Freq: Every day | ORAL | Status: DC
  Administered 2011-04-29 – 2011-04-30 (×2): 81 mg via ORAL
  Filled 2011-04-29 (×3): qty 1

## 2011-04-29 MED ORDER — ALPRAZOLAM 0.5 MG PO TABS
ORAL_TABLET | ORAL | Status: AC
  Administered 2011-04-29: 0.5 mg via ORAL
  Filled 2011-04-29: qty 1

## 2011-04-29 MED ORDER — MORPHINE SULFATE 2 MG/ML IJ SOLN: 2.0000 mg | Freq: Three times a day (TID) | INTRAMUSCULAR | Status: DC | PRN

## 2011-04-29 MED ORDER — ACETAMINOPHEN 500 MG PO TABS
1000.0000 mg | ORAL_TABLET | Freq: Three times a day (TID) | ORAL | Status: DC
  Administered 2011-04-29 (×3): 1000 mg via ORAL
  Filled 2011-04-29 (×9): qty 2

## 2011-04-29 MED ORDER — ALUM & MAG HYDROXIDE-SIMETH 200-200-20 MG/5ML PO SUSP
ORAL | Status: AC
  Administered 2011-04-29: 30 mL via ORAL
  Filled 2011-04-29: qty 30

## 2011-04-29 MED ORDER — PNEUMOCOCCAL VAC POLYVALENT 25 MCG/0.5ML IJ INJ
0.5000 mL | INJECTION | INTRAMUSCULAR | Status: AC
  Administered 2011-04-30: 0.5 mL via INTRAMUSCULAR
  Filled 2011-04-29: qty 0.5

## 2011-04-29 MED ORDER — IBUPROFEN 800 MG PO TABS
800.0000 mg | ORAL_TABLET | Freq: Once | ORAL | Status: AC
  Administered 2011-04-29: 800 mg via ORAL
  Filled 2011-04-29: qty 1

## 2011-04-29 MED ORDER — INSULIN ASPART 100 UNIT/ML ~~LOC~~ SOLN
0.0000 [IU] | Freq: Three times a day (TID) | SUBCUTANEOUS | Status: DC
  Administered 2011-04-29: 11 [IU] via SUBCUTANEOUS
  Administered 2011-04-29: 5 [IU] via SUBCUTANEOUS
  Administered 2011-04-29: 8 [IU] via SUBCUTANEOUS
  Administered 2011-04-30: 3 [IU] via SUBCUTANEOUS
  Administered 2011-04-30: 15 [IU] via SUBCUTANEOUS
  Filled 2011-04-29: qty 3

## 2011-04-29 MED ORDER — SENNA 8.6 MG PO TABS
1.0000 | ORAL_TABLET | Freq: Two times a day (BID) | ORAL | Status: DC
  Administered 2011-04-29 – 2011-04-30 (×3): 8.6 mg via ORAL
  Filled 2011-04-29 (×3): qty 1

## 2011-04-29 MED ORDER — ALPRAZOLAM ER 0.5 MG PO TB24
0.5000 mg | ORAL_TABLET | Freq: Two times a day (BID) | ORAL | Status: DC | PRN
  Administered 2011-04-29 – 2011-04-30 (×3): 0.5 mg via ORAL
  Filled 2011-04-29 (×3): qty 1

## 2011-04-29 MED ORDER — PANTOPRAZOLE SODIUM 40 MG PO TBEC
40.0000 mg | DELAYED_RELEASE_TABLET | Freq: Two times a day (BID) | ORAL | Status: DC
  Administered 2011-04-29 – 2011-04-30 (×3): 40 mg via ORAL
  Filled 2011-04-29 (×5): qty 1

## 2011-04-29 MED ORDER — DIPHENHYDRAMINE HCL 25 MG PO CAPS
25.0000 mg | ORAL_CAPSULE | Freq: Every day | ORAL | Status: DC | PRN
  Administered 2011-04-29 – 2011-04-30 (×2): 25 mg via ORAL
  Filled 2011-04-29 (×2): qty 1

## 2011-04-29 MED ORDER — LISINOPRIL 10 MG PO TABS
10.0000 mg | ORAL_TABLET | Freq: Every day | ORAL | Status: DC
  Administered 2011-04-29 – 2011-04-30 (×2): 10 mg via ORAL
  Filled 2011-04-29 (×3): qty 1

## 2011-04-29 NOTE — H&P (Signed)
PCP:   No primary provider on file.  Doesn't have one  Chief Complaint:  Chest pain  HPI: 55yoF with h/o HTN, DM, and likely homelessness presents with atypical chest pain.   Pt states that left sided chest pain developed yesterday, and is consistent with pain she's  previously had years ago and relates it to "bursitis" in her shoulders. It is described as  sharp, worse with deep breathing, on the left and radiating to the right side of her chest  without radiation elsewhere. When it hurts, she "has to hold" her left chest. It's worse  when she touches her chest, and she feels she can't breathe when she gets it. She denies any  falls or trauma, but her friend who's at bedside states it may be due to her carrying her  heavy luggage.   She was seen at [facility] earlier today and discharged given that this was atypical chest  pain. She then came to [facility], where through the day she's had 3 negative sets of  enzymes, and chem/CBC panel was unremarkable except BS's in the 200-300 range. Ddimer was  negative. CXR's showed bronchitis vs reactive airways, no PNA, and borderline cardiomegaly.   Admission was requested because this is the second evaluation for the day, and for social  issues. There are notes in EPIC from 2012 indicating she was homeless at that time. It  appears the patient was living in a motel but ran out of money, and now presents to the ED's  with all of her luggage and apparently a boyfriend/friend in tow. She states she was  admitted to a hospital in [place] just a month ago for the same type chest pain. She has  not had any of her medications since then, but cannot say what any of those medications are.  Of note, other than describing her pain and other various symptoms, she is very vague on her  medical history; for example when asked about the stenting that is indicated in EPIC, she  states that this was in [place] and that she "woke up with a bandage on my leg"  and cannot give  any further details, nor can she state if she's had an MI in the past.   ROS as above, otherwise generally pan-positive with SOB, abdominal pain, nausea, burning in  her hands akin to neuropathy, a lump in her RLE.   Past Medical History  Diagnosis Date  . Asthma   . Hypertension   . Diabetes mellitus    Past Surgical History  Procedure Date  . Abdominal hysterectomy   . Breast cyst excision   . Coronary angioplasty with stent placement     Entirely unclear. States this was in [facility] and cannot give any accurate details of this    Medications:  HOME MEDS: Pt cannot reconcile any meds, is quite vague  Prior to Admission medications   Medication Sig Start Date End Date Taking? Authorizing Provider  HYDROcodone-acetaminophen (NORCO) 5-325 MG per tablet Take 1 tablet by mouth every 6 (six) hours as needed for pain. 04/28/11 05/08/11 Yes Christopher W Lawyer, PA-C  ibuprofen (ADVIL,MOTRIN) 800 MG tablet Take 1 tablet (800 mg total) by mouth every 8 (eight) hours as needed for pain. 04/28/11 05/08/11 Yes Christopher W Lawyer, PA-C  insulin NPH-insulin regular (NOVOLIN 70/30) (70-30) 100 UNIT/ML injection Inject 10-15 Units into the skin 2 (two) times daily with a meal. Use 15 units in AM and 10 units in PM  Yes Historical Provider, MD    Allergies:  Allergies  Allergen Reactions  . Codeine Itching    Social History:   reports that she has been passively smoking.  She does not have any smokeless tobacco history on file. She reports that she drinks alcohol. She reports that she does not use illicit drugs. Probably homeless as EPIC notes indicate in 2012. Most recently was living in hotel until she ran out of money, now presents with all her luggage on her. Smokes a couple cigs every day, for past 5 yrs or so, but never up to a PPD. Denies drugs or alcohol.    Family History: No family history on file.  Physical Exam: Filed Vitals:   04/28/11 2345 04/29/11 0030  04/29/11 0130 04/29/11 0215  BP: 191/73 182/70 169/83 176/87  Pulse: 75 92 84 79  Temp:      TempSrc:      Resp: 13 19 18 18   SpO2: 99% 98% 97% 99%   Blood pressure 176/87, pulse 79, temperature 98 F (36.7 C), temperature source Oral, resp. rate 18, SpO2 99.00%.  Gen: Middle aged F in ED in no distress, repeatedly asking for pain meds, speech a bit  pressured and gets sad/tearful during conversation. Rambles on and has multitude of  complaints. Has a friend sleeping on a chair next to bed, and lots of various suitcases and  bags with her possessions next to the bed.  HEENT: Pupils round and reactive to light ~70mm, EOMI, sclera clear. Has numerous teeth  missing, mouth moist Lungs: CTAB no w/c/r  Heart: RRR no m/g, overall normal exam. Her chest is diffusely TTP more on the left than the  right, but she jumps and endorses pain with minimal palpation.  Abd: Soft, NT NT, benign Extrem: Warm, perfusing normally, bilateral radials palpable. No BLE edema Neuro: Alert, attentive, conversant, CN2-12 intact, moves extremities spontaneously, grossly  non-focal   Labs & Imaging Results for orders placed during the hospital encounter of 04/28/11 (from the past 48 hour(s))  CARDIAC PANEL(CRET KIN+CKTOT+MB+TROPI)     Status: Normal   Collection Time   04/28/11  9:30 PM      Component Value Range Comment   Total CK 165  7 - 177 (U/L)    CK, MB 2.9  0.3 - 4.0 (ng/mL)    Troponin I <0.30  <0.30 (ng/mL)    Relative Index 1.8  0.0 - 2.5    POCT I-STAT, CHEM 8     Status: Abnormal   Collection Time   04/28/11 10:08 PM      Component Value Range Comment   Sodium 139  135 - 145 (mEq/L)    Potassium 3.7  3.5 - 5.1 (mEq/L)    Chloride 105  96 - 112 (mEq/L)    BUN 13  6 - 23 (mg/dL)    Creatinine, Ser 8.29  0.50 - 1.10 (mg/dL)    Glucose, Bld 562 (*) 70 - 99 (mg/dL)    Calcium, Ion 1.30 (*) 1.12 - 1.32 (mmol/L)    TCO2 23  0 - 100 (mmol/L)    Hemoglobin 15.6 (*) 12.0 - 15.0 (g/dL)    HCT 86.5   78.4 - 69.6 (%)    Dg Chest 2 View  04/28/2011  *RADIOLOGY REPORT*  Clinical Data: Chest pain.  CHEST - 2 VIEW  Comparison: 09/02/2010  Findings: Airway thickening may reflect bronchitis or reactive airways disease.  No airspace opacity is identified to suggest bacterial pneumonia pattern.  Borderline  cardiomegaly noted without edema.  No pleural effusion is observed.  IMPRESSION:  1. Airway thickening may reflect bronchitis or reactive airways disease.  No airspace opacity is identified to suggest bacterial pneumonia pattern. 2.  Borderline cardiomegaly.  Original Report Authenticated By: Dellia Cloud, M.D.   Dg Chest Port 1 View  04/28/2011  *RADIOLOGY REPORT*  Clinical Data: Chest pain.  PORTABLE CHEST - 1 VIEW  Comparison: Chest radiograph performed earlier today at 01:40 p.m.  Findings: The lungs are hypoexpanded.  Chronically increased interstitial markings are noted.  There is no evidence of focal opacification, pleural effusion or pneumothorax.  The cardiomediastinal silhouette is borderline normal in size.  No acute osseous abnormalities are seen.  IMPRESSION: Lungs hypoexpanded; mild chronic lung changes seen.  No acute cardiopulmonary process identified.  Original Report Authenticated By: Tonia Ghent, M.D.   ECG: NSR 77 bpm, LAD. LAE and LVH. No Q waves, narrow QRS. No ST segment deviations. Flat TW  laterally. Overall not changed compared to prior and not ischemic.   Impression Present on Admission:  .Atypical chest pain .Hypertension .Diabetes mellitus  55yoF with h/o HTN, DM, and likely homelessness presents with atypical chest pain.   1. Chest pain: Although she has HTN, DM as risk factors and reported surgical history of  "coronary angioplasty with stent placement" (that she cannot in any definite terms explain  the details of), this does not at all seem coronary in nature -- she has already had 3  negative Trop's through today, EKG is non-ischemic, history is  atypical, and she jumps at  the slightest palpation of her chest.   Pericarditis is considered, but ECG is not consistent. Pleuritis/serositis vs  costochondritis vs diabetic neuropathy of the chest vs GI etiology vs secondary gain is a  reasonable differential. Negative Ddimer has effectively ruled out PE and makes other  serious inflammatory illnesses less likely.    - Will not treat for ACS or continue trending enzymes or ECG. DM control as below. High dose  tylenol and NSAID's, GI cocktail and start PPI. Avoid narcotics. Start ASA 81 mg daily  2. Social: Overall, my gestalt is that this is a social admission. She doesn't seem to me to  have anywhere to go.  - Admit observation. SW consultation, needs PCP to be set up, access to medications  addressed  3. HTN: Start lisinopril 10 mg daily  4. DM: Start SSI and consider metformin on d/c. Uncontrolled DM is very likely playing a  role in her various somatic complaints. Will try to add on an A1c to her bloodwork.   5. No further am labs or other bloodwork needed at this point.   Telemetry, [facility] team 2 Presumed full   Other plans as per orders.  Dondra Rhett 04/29/2011, 2:42 AM

## 2011-04-29 NOTE — Plan of Care (Signed)
Pt admitted from ed with atypical chest pain, glucose 308, no orders noted for ssi, md paged for further clarification, iv team notified of need for PIV

## 2011-04-29 NOTE — Progress Notes (Signed)
Inpatient Diabetes Program Recommendations  AACE/ADA: New Consensus Statement on Inpatient Glycemic Control (2009)  Target Ranges:  Prepandial:   less than 140 mg/dL      Peak postprandial:   less than 180 mg/dL (1-2 hours)      Critically ill patients:  140 - 180 mg/dL   Inpatient Diabetes Program Recommendations Diet: change diet to carbohydrate modified medium Pending A1C result

## 2011-04-29 NOTE — Progress Notes (Signed)
I have seen and examined outpatient, still with intermittent chest pain, and with reproducible chest wall tenderness, worse with movement. Will continue current management plan as per Dr. Kaylyn Layer, also cycle enzymes given her risk factors, continue and NSAIDs and add PPI. Patient is homeless, will consult case management to assist with DC planning/ resources when stable for discharge. Her chest x-ray is negative for acute infiltrates, and her d-dimer is not elevated.

## 2011-04-29 NOTE — ED Notes (Signed)
[place], MD at bedside. 

## 2011-04-29 NOTE — Plan of Care (Signed)
Text paged md regarding orders for SSI

## 2011-04-29 NOTE — Progress Notes (Signed)
UR completed 

## 2011-04-30 LAB — GLUCOSE, CAPILLARY
Glucose-Capillary: 229 mg/dL — ABNORMAL HIGH (ref 70–99)
Glucose-Capillary: 374 mg/dL — ABNORMAL HIGH (ref 70–99)

## 2011-04-30 LAB — CARDIAC PANEL(CRET KIN+CKTOT+MB+TROPI)
Relative Index: 1.8 (ref 0.0–2.5)
Total CK: 121 U/L (ref 7–177)

## 2011-04-30 MED ORDER — METFORMIN HCL 500 MG PO TABS: 500.0000 mg | ORAL_TABLET | Freq: Two times a day (BID) | ORAL | Status: DC

## 2011-04-30 MED ORDER — LISINOPRIL 10 MG PO TABS: 10.0000 mg | ORAL_TABLET | Freq: Every day | ORAL | Status: DC

## 2011-04-30 MED ORDER — IBUPROFEN 800 MG PO TABS: 800.0000 mg | ORAL_TABLET | Freq: Three times a day (TID) | ORAL | Status: AC | PRN

## 2011-04-30 MED ORDER — ACETAMINOPHEN 325 MG PO TABS: 650.0000 mg | ORAL_TABLET | Freq: Once | ORAL | Status: DC

## 2011-04-30 MED ORDER — OMEPRAZOLE 40 MG PO CPDR: 40.0000 mg | DELAYED_RELEASE_CAPSULE | Freq: Two times a day (BID) | ORAL | Status: DC

## 2011-04-30 MED ORDER — ALPRAZOLAM ER 0.5 MG PO TB24: 0.5000 mg | ORAL_TABLET | Freq: Every day | ORAL | Status: AC | PRN

## 2011-04-30 MED ORDER — ALBUTEROL SULFATE HFA 108 (90 BASE) MCG/ACT IN AERS: 2.0000 | INHALATION_SPRAY | Freq: Four times a day (QID) | RESPIRATORY_TRACT | Status: DC | PRN

## 2011-04-30 NOTE — Progress Notes (Signed)
Pt requested pain med for headache and eye pain. BP checked and was found to be 161/87 and BS was 229. Pt only had Morphine ordered for severe pain. No other PRNS for pain, BP or BS. Contacted on-call Westley Hummer, NP. New orders received to address pain. NP decided not to treat BS or BP. Pt also refused the newly ordered med. Will continue to monitor pt. Mechele Collin

## 2011-04-30 NOTE — ED Provider Notes (Signed)
Medical screening examination/treatment/procedure(s) were conducted as a shared visit with non-physician practitioner(s) and myself.  I personally evaluated the patient during the encounter  Chest reproduced with palpation of L upper chest wall. Unlikely, life-threatening cause for pt's symptoms.   Loren Racer, MD 04/30/11 [phone]

## 2011-04-30 NOTE — Progress Notes (Signed)
04-30-11 Made follow up appt for [facility], Dr Clelia Croft on 06-26-11 at 11am. Information about eligibility and so forth given to patient prior to dc. [place], [place] [phone]

## 2011-04-30 NOTE — Discharge Summary (Signed)
Discharge Note  Name: Vanessa Hull MRN: [id number] DOB: 07-17-1955 56 y.o.  Date of Admission: 04/28/2011  9:12 PM Date of Discharge: 04/30/2011 Attending Physician: Leroy Sea, MD  Discharge Diagnosis: Principal Problem:  *Atypical chest pain Active Problems:  Hypertension  Diabetes mellitus  Homelessness   Discharge Medications: Medication List  As of 04/30/2011  4:28 PM   STOP taking these medications         HYDROcodone-acetaminophen 5-325 MG per tablet         TAKE these medications         albuterol 108 (90 BASE) MCG/ACT inhaler   Commonly known as: PROVENTIL HFA;VENTOLIN HFA   Inhale 2 puffs into the lungs every 6 (six) hours as needed for wheezing or shortness of breath.      ALPRAZolam 0.5 MG 24 hr tablet   Commonly known as: XANAX XR   Take 1 tablet (0.5 mg total) by mouth daily as needed.      ibuprofen 800 MG tablet   Commonly known as: ADVIL,MOTRIN   Take 1 tablet (800 mg total) by mouth every 8 (eight) hours as needed for pain.      insulin NPH-insulin regular (70-30) 100 UNIT/ML injection   Commonly known as: NOVOLIN 70/30   Inject 10-15 Units into the skin 2 (two) times daily with a meal. Use 15 units in AM and 10 units in PM      lisinopril 10 MG tablet   Commonly known as: PRINIVIL,ZESTRIL   Take 1 tablet (10 mg total) by mouth daily.      omeprazole 40 MG capsule   Commonly known as: PRILOSEC   Take 1 capsule (40 mg total) by mouth 2 (two) times daily.            Disposition and follow-up:   Vanessa Hull was discharged from [facility] in improved/stable condition.    Follow-up Appointments: Discharge Orders    Future Orders Please Complete By Expires   Diet Carb Modified      Increase activity slowly         Consultations:    Procedures Performed:  Dg Chest 2 View  04/28/2011  *RADIOLOGY REPORT*  Clinical Data: Chest pain.  CHEST - 2 VIEW  Comparison: 09/02/2010  Findings: Airway thickening may reflect  bronchitis or reactive airways disease.  No airspace opacity is identified to suggest bacterial pneumonia pattern.  Borderline cardiomegaly noted without edema.  No pleural effusion is observed.  IMPRESSION:  1. Airway thickening may reflect bronchitis or reactive airways disease.  No airspace opacity is identified to suggest bacterial pneumonia pattern. 2.  Borderline cardiomegaly.  Original Report Authenticated By: Dellia Cloud, M.D.   Dg Chest Port 1 View  04/28/2011  *RADIOLOGY REPORT*  Clinical Data: Chest pain.  PORTABLE CHEST - 1 VIEW  Comparison: Chest radiograph performed earlier today at 01:40 p.m.  Findings: The lungs are hypoexpanded.  Chronically increased interstitial markings are noted.  There is no evidence of focal opacification, pleural effusion or pneumothorax.  The cardiomediastinal silhouette is borderline normal in size.  No acute osseous abnormalities are seen.  IMPRESSION: Lungs hypoexpanded; mild chronic lung changes seen.  No acute cardiopulmonary process identified.  Original Report Authenticated By: Tonia Ghent, M.D.      Admission HPI Pt is a 55yoF with h/o HTN, DM, and likely homelessness presents with atypical chest pain.  Pt states that left sided chest pain developed yesterday, and is consistent with pain she's  previously  had years ago and relates it to "bursitis" in her shoulders. It is described as  sharp, worse with deep breathing, on the left and radiating to the right side of her chest  without radiation elsewhere. When it hurts, she "has to hold" her left chest. It's worse  when she touches her chest, and she feels she can't breathe when she gets it. She denies any  falls or trauma, but her friend who's at bedside states it may be due to her carrying her  heavy luggage.  She was seen at [facility] earlier today and discharged given that this was atypical chest  pain. She then came to [facility], where through the day she's had 3 negative sets of    enzymes, and chem/CBC panel was unremarkable except BS's in the 200-300 range. Ddimer was  negative. CXR's showed bronchitis vs reactive airways, no PNA, and borderline cardiomegaly.  Admission was requested because this is the second evaluation for the day, and for social  issues. There are notes in EPIC from 2012 indicating she was homeless at that time. It  appears the patient was living in a motel but ran out of money, and now presents to the ED's  with all of her luggage and apparently a boyfriend/friend in tow. She states she was  admitted to a hospital in [place] just a month ago for the same type chest pain. She has  not had any of her medications since then, but cannot say what any of those medications are.  Of note, other than describing her pain and other various symptoms, she is very vague on her  medical history; for example when asked about the stenting that is indicated in EPIC, she  states that this was in [place] and that she "woke up with a bandage on my leg" and cannot give  any further details, nor can she state if she's had an MI in the past. She was admitted for further evaluation and management.  Physical exam  General Appearance:    Alert, cooperative, no distress  Lungs:     Clear to auscultation bilaterally, respirations unlabored   Heart:    Regular rate and rhythm, S1 and S2 normal, no murmur, rub   or gallop, reproducible left sided chest wall tenderness   Abdomen:     Soft, non-tender, bowel sounds active all four quadrants,    no masses, no organomegaly  Extremities:   no cyanosis or edema  Neurologic:   CNII-XII intact, nonfocal        Hospital Course by problem list: Principal Problem:  *Atypical chest pain Active Problems:  Hypertension  Diabetes mellitus  Homelessness  1. Chest pain: Upon admission it was noted that Although she has HTN, DM as risk factors and reported surgical history of  "coronary angioplasty with stent placement" (that she  could not in any definite terms explain  the details of), this does not at all seem coronary in nature -- she had already had 3  negative Trop's  At [facility], EKG is non-ischemic, history is atypical, and she had reproducible chest wall tenderness to palpation. ECG was not consistent with pericarditis. A d-dimer was also done and it came back negative, she also had further cardiac enzymes cycled and they came back negative as well. The Impression was that the pain was more likely musculoskeletal/costochondritis and she was treated with NSAIDs, PPI was added GI prophylaxis while she's on the NSAIDs. Should be discharged on this regimen and she  is to followup with her primary care physician outpatient-[facility]. Homelessness-SW was consulted , and with the [employer]/project patient has been set up for temporal housing in what [place] which is been discharged to. 3. HTN: Patient was started on lisinopril 10 mg daily, she is to continue this upon discharge.  4. uncontrolled DM: A hemoglobin A1c was done upon admission and it came back elevated at 9.3, her Accu-Cheks were monitored and she was cut with sliding scale insulin. She is being discharged on metformin and She has been given literature on diabetic diet prior to discharge.     Discharge Vitals:  BP 137/78  Pulse 75  Temp(Src) 97.3 F (36.3 C) (Oral)  Resp 18  Ht 5\' 6"  (1.676 m)  Wt 78.4 kg (172 lb 13.5 oz)  BMI 27.90 kg/m2  SpO2 99%  Discharge Labs:  Results for orders placed during the hospital encounter of 04/28/11 (from the past 24 hour(s))  GLUCOSE, CAPILLARY     Status: Abnormal   Collection Time   04/29/11  5:13 PM      Component Value Range   Glucose-Capillary 280 (*) 70 - 99 (mg/dL)   Comment 1 Documented in Chart     Comment 2 Notify RN    CARDIAC PANEL(CRET KIN+CKTOT+MB+TROPI)     Status: Normal   Collection Time   04/29/11  5:52 PM      Component Value Range   Total CK 134  7 - 177 (U/L)   CK, MB 2.5   0.3 - 4.0 (ng/mL)   Troponin I <0.30  <0.30 (ng/mL)   Relative Index 1.9  0.0 - 2.5   GLUCOSE, CAPILLARY     Status: Abnormal   Collection Time   04/29/11  8:23 PM      Component Value Range   Glucose-Capillary 166 (*) 70 - 99 (mg/dL)  GLUCOSE, CAPILLARY     Status: Abnormal   Collection Time   04/30/11  3:23 AM      Component Value Range   Glucose-Capillary 229 (*) 70 - 99 (mg/dL)   Comment 1 Notify RN    GLUCOSE, CAPILLARY     Status: Abnormal   Collection Time   04/30/11  8:13 AM      Component Value Range   Glucose-Capillary 189 (*) 70 - 99 (mg/dL)   Comment 1 Notify RN    CARDIAC PANEL(CRET KIN+CKTOT+MB+TROPI)     Status: Normal   Collection Time   04/30/11 10:20 AM      Component Value Range   Total CK 121  7 - 177 (U/L)   CK, MB 2.2  0.3 - 4.0 (ng/mL)   Troponin I <0.30  <0.30 (ng/mL)   Relative Index 1.8  0.0 - 2.5   GLUCOSE, CAPILLARY     Status: Abnormal   Collection Time   04/30/11 11:30 AM      Component Value Range   Glucose-Capillary 374 (*) 70 - 99 (mg/dL)   Comment 1 Notify RN      SignedKela Millin 04/30/2011, 4:28 PM

## 2011-04-30 NOTE — Progress Notes (Signed)
CSW received referral that patient is homeless. CSW spoke with patient who informed CSW that she had been living in a hotel, [address] [place], [place]40[place]day. She had been in [place], [place] "taking care of some business" and came back to [place] via bus 2 weeks ago. She had paid for 2 weeks at [facility] but then ran out of money. She gets her monthly $1,090 supplemental security income check on the first of the month and until then would not have a place to live. Homeless shelters in & around the [place] are currently full. CSW spoke with CSW Director who agreed to have patient accepted into [employer] which provides patient with hotel stay at [facility] [phone] [facility].) for 10 nights (Thursday, 1/24 - Sunday 2/3), 2 [employer] giftcards totaling $200 for food and prescriptions, 1 31-day unlimited ride local adult bus pass & congregational nursing and social work follow-up. Patient ok to discharge once Dr. Suanne Marker clears her. RN, Irving Burton made aware. CSW signing off.   Unice Bailey, LCSWA [phone]

## 2011-09-08 ENCOUNTER — Emergency Department (HOSPITAL_COMMUNITY)
Admission: EM | Admit: 2011-09-08 | Discharge: 2011-09-08 | Disposition: A | Discharge status: Home / Self Care | Payer: Medicare Other | Attending: Emergency Medicine | Admitting: Emergency Medicine

## 2011-09-08 ENCOUNTER — Encounter (HOSPITAL_COMMUNITY): Payer: Self-pay | Admitting: *Deleted

## 2011-09-08 ENCOUNTER — Emergency Department (HOSPITAL_COMMUNITY): Payer: Medicare Other

## 2011-09-08 DIAGNOSIS — IMO0001 Reserved for inherently not codable concepts without codable children: Secondary | ICD-10-CM | POA: Insufficient documentation

## 2011-09-08 DIAGNOSIS — R0602 Shortness of breath: Secondary | ICD-10-CM | POA: Insufficient documentation

## 2011-09-08 DIAGNOSIS — H9209 Otalgia, unspecified ear: Secondary | ICD-10-CM | POA: Insufficient documentation

## 2011-09-08 DIAGNOSIS — H538 Other visual disturbances: Secondary | ICD-10-CM | POA: Insufficient documentation

## 2011-09-08 DIAGNOSIS — E119 Type 2 diabetes mellitus without complications: Secondary | ICD-10-CM | POA: Insufficient documentation

## 2011-09-08 DIAGNOSIS — I10 Essential (primary) hypertension: Secondary | ICD-10-CM | POA: Insufficient documentation

## 2011-09-08 DIAGNOSIS — R059 Cough, unspecified: Secondary | ICD-10-CM | POA: Insufficient documentation

## 2011-09-08 DIAGNOSIS — Z794 Long term (current) use of insulin: Secondary | ICD-10-CM | POA: Insufficient documentation

## 2011-09-08 DIAGNOSIS — R05 Cough: Secondary | ICD-10-CM | POA: Insufficient documentation

## 2011-09-08 DIAGNOSIS — M79609 Pain in unspecified limb: Secondary | ICD-10-CM | POA: Insufficient documentation

## 2011-09-08 DIAGNOSIS — R5381 Other malaise: Secondary | ICD-10-CM | POA: Insufficient documentation

## 2011-09-08 DIAGNOSIS — R0789 Other chest pain: Secondary | ICD-10-CM

## 2011-09-08 LAB — GLUCOSE, CAPILLARY: Glucose-Capillary: 167 mg/dL — ABNORMAL HIGH (ref 70–99)

## 2011-09-08 LAB — COMPREHENSIVE METABOLIC PANEL
ALT: 13 U/L (ref 0–35)
AST: 28 U/L (ref 0–37)
Albumin: 3.7 g/dL (ref 3.5–5.2)
CO2: 22 mEq/L (ref 19–32)
Chloride: 104 mEq/L (ref 96–112)
Creatinine, Ser: 0.92 mg/dL (ref 0.50–1.10)
Potassium: 4.5 mEq/L (ref 3.5–5.1)
Sodium: 138 mEq/L (ref 135–145)
Total Bilirubin: 0.3 mg/dL (ref 0.3–1.2)

## 2011-09-08 LAB — CBC
MCV: 81.5 fL (ref 78.0–100.0)
Platelets: 238 10*3/uL (ref 150–400)
RBC: 4.75 MIL/uL (ref 3.87–5.11)
RDW: 13.4 % (ref 11.5–15.5)
WBC: 5.8 10*3/uL (ref 4.0–10.5)

## 2011-09-08 LAB — PROTIME-INR: INR: 0.94 (ref 0.00–1.49)

## 2011-09-08 MED ORDER — TRAMADOL HCL 50 MG PO TABS: 50.0000 mg | ORAL_TABLET | Freq: Four times a day (QID) | ORAL | Status: DC | PRN

## 2011-09-08 MED ORDER — TRAMADOL HCL 50 MG PO TABS: 50.0000 mg | ORAL_TABLET | Freq: Four times a day (QID) | ORAL | Status: AC | PRN

## 2011-09-08 MED ORDER — ALBUTEROL SULFATE HFA 108 (90 BASE) MCG/ACT IN AERS: 2.0000 | INHALATION_SPRAY | Freq: Four times a day (QID) | RESPIRATORY_TRACT | Status: DC | PRN

## 2011-09-08 MED ORDER — METFORMIN HCL 500 MG PO TABS: 500.0000 mg | ORAL_TABLET | Freq: Two times a day (BID) | ORAL | Status: DC

## 2011-09-08 MED ORDER — SODIUM CHLORIDE 0.9 % IV SOLN: 1000.0000 mL | INTRAVENOUS | Status: DC

## 2011-09-08 MED ORDER — LISINOPRIL 10 MG PO TABS: 10.0000 mg | ORAL_TABLET | Freq: Every day | ORAL | Status: DC

## 2011-09-08 MED ORDER — ASPIRIN 81 MG PO CHEW
324.0000 mg | CHEWABLE_TABLET | Freq: Once | ORAL | Status: AC
  Administered 2011-09-08: 324 mg via ORAL
  Filled 2011-09-08: qty 4

## 2011-09-08 MED ORDER — OMEPRAZOLE 40 MG PO CPDR: 40.0000 mg | DELAYED_RELEASE_CAPSULE | Freq: Two times a day (BID) | ORAL | Status: DC

## 2011-09-08 NOTE — ED Notes (Signed)
Pt given contact info for [facility]

## 2011-09-08 NOTE — ED Notes (Signed)
Pt has multiple complaints: chest pain and left arm pain that began yesterday, upset stomach "can't afford stomach medications or my nerve pills, I'm out of all of my medication and I'm diabetic" also c/o pain with breathing, hair falling out, blurred vision, and falls.

## 2011-09-08 NOTE — ED Provider Notes (Addendum)
History     CSN: [id number] Arrival date & time 09/08/11  1323 First MD Initiated Contact with Patient 09/08/11 1504      Chief Complaint  Patient presents with  . Weakness  . Arm Pain  . Chest Pain  . Fall  . Nausea   HPI Pt's main issue is her chest discomfort.  This started yesterday.  It has been intermittent.  It seems to last several hours at a time "half the day".  It is an aching discomfort in the center of her chest.  Nothing seems to make it better or worse, ie deep breathing, movement.  She has had a lot of cough.  Bringing up sputum.  The pain radiates sometimes to the right.  She has had some nausea.  The pain also aches in her left arm.  Pt has history of CAD, history of 4 stents in 4 different cities ([place], [place], [place], [place]).  Pt ran out of her medications.  She does not have an MD now.  She lost her paperwork. Past Medical History  Diagnosis Date  . Asthma   . Hypertension   . Diabetes mellitus     Past Surgical History  Procedure Date  . Abdominal hysterectomy   . Breast cyst excision   . Coronary angioplasty with stent placement     Entirely unclear. States this was in [place] and cannot give any accurate details of this    History reviewed. No pertinent family history.  History  Substance Use Topics  . Smoking status: Passive Smoker -- 5.0 packs/day for 5 years    Types: Cigarettes  . Smokeless tobacco: Not on file  . Alcohol Use: Yes     beer sometimes    OB History    Grav Para Term Preterm Abortions TAB SAB Ect Mult Living                  Review of Systems  Constitutional: Positive for fatigue.  HENT: Positive for ear pain.   Eyes: Positive for visual disturbance (blurred for a few weeks).  Respiratory: Positive for cough.   Gastrointestinal: Negative for abdominal pain.  Genitourinary: Negative for hematuria.  Musculoskeletal:       General muscle aches  Neurological: Negative for tremors, seizures, speech difficulty, numbness  and headaches.       Frequent falls,   All other systems reviewed and are negative.    Allergies  Codeine  Home Medications   Current Outpatient Rx  Name Route Sig Dispense Refill  . ALBUTEROL SULFATE HFA 108 (90 BASE) MCG/ACT IN AERS Inhalation Inhale 2 puffs into the lungs every 6 (six) hours as needed for wheezing or shortness of breath. 8.5 g 0  . INSULIN ISOPHANE & REGULAR (70-30) 100 UNIT/ML [place] SUSP Subcutaneous Inject 10-15 Units into the skin 2 (two) times daily with a meal. Use 15 units in AM and 10 units in PM    . LISINOPRIL 10 MG PO TABS Oral Take 1 tablet (10 mg total) by mouth daily. 30 tablet 0  . METFORMIN HCL 500 MG PO TABS Oral Take 1 tablet (500 mg total) by mouth 2 (two) times daily with a meal. 60 tablet 0  . OMEPRAZOLE 40 MG PO CPDR Oral Take 1 capsule (40 mg total) by mouth 2 (two) times daily. 60 capsule 0    BP 176/93  Pulse 113  Temp(Src) 98.6 F (37 C) (Oral)  Resp 18  SpO2 99%  Physical Exam  Nursing  note and vitals reviewed. Constitutional: She appears well-developed and well-nourished. No distress.  HENT:  Head: Normocephalic and atraumatic.  Right Ear: External ear normal.  Left Ear: External ear normal.  Eyes: Conjunctivae are normal. Right eye exhibits no discharge. Left eye exhibits no discharge. No scleral icterus.  Neck: Neck supple. No tracheal deviation present.  Cardiovascular: Normal rate, regular rhythm and intact distal pulses.   Pulmonary/Chest: Effort normal and breath sounds normal. No stridor. No respiratory distress. She has no wheezes. She has no rales. She exhibits tenderness.  Abdominal: Soft. Bowel sounds are normal. She exhibits no distension. There is no tenderness. There is no rebound and no guarding.  Musculoskeletal: She exhibits no edema and no tenderness.       Superficial healing abrasion left leg  Neurological: She is alert. She has normal strength. No sensory deficit. Cranial nerve deficit:  no gross defecits noted.  She exhibits normal muscle tone. She displays no seizure activity. Coordination normal.  Skin: Skin is warm and dry. No rash noted.    ED Course  Procedures (including critical care time)  Rate: 94  Rhythm: normal sinus rhythm  QRS Axis: normal  Intervals: normal  ST/T Wave abnormalities: normal  Conduction Disutrbances:none  Narrative Interpretation: early precordial r/s transition  Old EKG Reviewed: none available  Labs Reviewed  GLUCOSE, CAPILLARY - Abnormal; Notable for the following:    Glucose-Capillary 167 (*)    All other components within normal limits  COMPREHENSIVE METABOLIC PANEL - Abnormal; Notable for the following:    Glucose, Bld 154 (*)    GFR calc non Af Amer 68 (*)    GFR calc Af Amer 79 (*)    All other components within normal limits  CBC  PROTIME-INR  APTT  TROPONIN I   Dg Chest 2 View  09/08/2011  *RADIOLOGY REPORT*  Clinical Data: Chest pain and short of breath  CHEST - 2 VIEW  Comparison: 04/28/2011  Findings: Moderate cardiomegaly.  Left basilar atelectasis and elevation of the left hemidiaphragm.  Normal vascularity.  Stable bronchitic changes. Stable thoracic spine compared with 04/28/2011.  IMPRESSION: Cardiomegaly without edema.  Left basilar atelectasis.  Original Report Authenticated By: Donavan Burnet, M.D.   Ct Head Wo Contrast  09/08/2011  *RADIOLOGY REPORT*  Clinical Data: Left arm pain, weakness  CT HEAD WITHOUT CONTRAST  Technique:  Contiguous axial images were obtained from the base of the skull through the vertex without contrast.  Comparison: None.  Findings: No acute intracranial hemorrhage, infarction, focal edema, or sulcal effacement.  No midline shift, herniation or hydrocephalus.  No extra-axial fluid collection.  Gray-white matter differentiation maintained.  In the midline posterior to the pineal gland there is a CSF density well circumscribed cystic area measuring 2.4 x 1.9 cm, image 15 most consistent with and infratentorial arachnoid  cyst.  No cerebellar abnormality.  Mastoids and sinuses clear.  No skull abnormality.  IMPRESSION: No acute intracranial finding.  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  No significant mass effect or hydrocephalus.  Original Report Authenticated By: Judie Petit. Ruel Favors, M.D.     No diagnosis found.    MDM  The patient presented to the emergency room with numerous complaints. She is very atypical chest discomfort. She has also been complaining of a wound that appears very superficial in her ankle. She states she has been having some trouble with falls that she has been anxious and out of her nerve pills.  I tried to clarify the patient's  cardiac history and it seems somewhat confusing. She states she has had a cardiac catheterization procedure in several states. I am unable to determine whether she indeed had a cardiac stent versus an evaluation for chest pain.  Regardless, at a low suspicion for acute coronary syndrome at this time.  Her symptoms have been constant for at least half a day.  I will refill the patient's blood pressure medication her metformin her blood sugars only mildly elevated and I am not sure whether she needs to continue taking insulin. I recommended she follow up with primary care Dr. for further evaluation.  At this time there does not appear to be any evidence of an acute emergency medical condition and the patient appears stable for discharge with appropriate outpatient follow up.   Reviewed her old records, she had a similar presentation back in January. They could not confirm acute cardiac history as well.    Celene Kras, MD 09/08/11 [id number]

## 2011-09-08 NOTE — Discharge Instructions (Signed)
Chest Pain (Nonspecific) It is often hard to give a specific diagnosis for the cause of chest pain. There is always a chance that your pain could be related to something serious, such as a heart attack or a blood clot in the lungs. You need to follow up with your caregiver for further evaluation. CAUSES   Heartburn.   Pneumonia or bronchitis.   Anxiety or stress.   Inflammation around your heart (pericarditis) or lung (pleuritis or pleurisy).   A blood clot in the lung.   A collapsed lung (pneumothorax). It can develop suddenly on its own (spontaneous pneumothorax) or from injury (trauma) to the chest.   Shingles infection (herpes zoster virus).  The chest wall is composed of bones, muscles, and cartilage. Any of these can be the source of the pain.  The bones can be bruised by injury.   The muscles or cartilage can be strained by coughing or overwork.   The cartilage can be affected by inflammation and become sore (costochondritis).  DIAGNOSIS  Lab tests or other studies, such as X-rays, electrocardiography, stress testing, or cardiac imaging, may be needed to find the cause of your pain.  TREATMENT   Treatment depends on what may be causing your chest pain. Treatment may include:   Acid blockers for heartburn.   Anti-inflammatory medicine.   Pain medicine for inflammatory conditions.   Antibiotics if an infection is present.   You may be advised to change lifestyle habits. This includes stopping smoking and avoiding alcohol, caffeine, and chocolate.   You may be advised to keep your head raised (elevated) when sleeping. This reduces the chance of acid going backward from your stomach into your esophagus.   Most of the time, nonspecific chest pain will improve within 2 to 3 days with rest and mild pain medicine.  HOME CARE INSTRUCTIONS   If antibiotics were prescribed, take your antibiotics as directed. Finish them even if you start to feel better.   For the next few  days, avoid physical activities that bring on chest pain. Continue physical activities as directed.   Do not smoke.   Avoid drinking alcohol.   Only take over-the-counter or prescription medicine for pain, discomfort, or fever as directed by your caregiver.   Follow your caregiver's suggestions for further testing if your chest pain does not go away.   Keep any follow-up appointments you made. If you do not go to an appointment, you could develop lasting (chronic) problems with pain. If there is any problem keeping an appointment, you must call to reschedule.  SEEK MEDICAL CARE IF:   You think you are having problems from the medicine you are taking. Read your medicine instructions carefully.   Your chest pain does not go away, even after treatment.   You develop a rash with blisters on your chest.  SEEK IMMEDIATE MEDICAL CARE IF:   You have increased chest pain or pain that spreads to your arm, neck, jaw, back, or abdomen.   You develop shortness of breath, an increasing cough, or you are coughing up blood.   You have severe back or abdominal pain, feel nauseous, or vomit.   You develop severe weakness, fainting, or chills.   You have a fever.  THIS IS AN EMERGENCY. Do not wait to see if the pain will go away. Get medical help at once. Call your local emergency services (911 in [place] Do not drive yourself to the hospital. MAKE SURE YOU:   Understand these instructions.     Will watch your condition.   Will get help right away if you are not doing well or get worse.  Document Released: 12/31/2004 Document Revised: 03/12/2011 Document Reviewed: 10/27/2007 ExitCare Patient Information 2012 [employer].  RESOURCE GUIDE  Chronic Pain Problems: Contact [facility]  [phone] Patients need to be referred by their primary care doctor.  Insufficient Money for Medicine: Contact United Way:  call "211" or [facility] 271-5999.  No Primary Care  Doctor: - Call [facility]  [phone] - can help you locate a primary care doctor that  accepts your insurance, provides certain services, etc. - Physician Referral Service- 1-800-533-3463  Agencies that provide inexpensive medical care: - [facility]  [phone] - [facility]  [phone] - [facility]  [phone] - [facility]  [phone] - [facility]  [phone] - [facility]  [phone]  Medicaid-accepting [place] Providers: - [facility]- [address]  [phone], Mon-Fri 9am-7pm, Sat 9am-1pm - [facility]- [address], [address]  [phone] - [facility]- [address], [address]  [phone] - [facility]- [address] [place] [address]  [phone] - Veita Bland- [address], [address], [phone]  Only accepts [place] Access Medicaid patients after they have their name  applied to their card  Self Pay (no insurance) in [place]: - Sickle Cell Patients: Dr Eric Dean, [facility]  [address], [phone] - [facility]- [address]  [phone]       -      Urgent Care - [address]       -     [facility]- see information above (Speak to Pam H if you do not have insurance)       -  [facility]- [address], [phone]       -  [facility] [place]- [address],  [phone]       -  [facility]- [address] [place] [address], [phone]       -  Dr Osei-Bonsu-  [address], [place], [phone]       -  [facility]- [address], [phone]       -  [facility]- [address] [place] [address], [phone], also [address], [phone]       -    [facility]- [address], [phone], 1st & 3rd Saturday   every month, 10am-1pm  1) Find a Doctor and Pay Out of  Pocket Although you won't have to find out who is covered by your insurance plan, it is a good idea to ask around and get recommendations. You will then need to call the office and see if the doctor you have chosen will accept you as a new patient and what types of options they offer for patients who are self-pay. Some doctors offer discounts or will set up payment plans for their patients who do not have insurance, but you will need to ask so you aren't surprised when you get to your appointment.  2) Contact Your Local Health Department Not all health departments have doctors that can see patients for sick visits, but many do, so it is worth a call to see if yours   does. If you don't know where your local health department is, you can check in your phone book. The CDC also has a tool to help you locate your state's health department, and many state websites also have listings of all of their local health departments.  3) Find a Walk-in Clinic If your illness is not likely to be very severe or complicated, you may want to try a walk in clinic. These are popping up all over the country in pharmacies, drugstores, and shopping centers. They're usually staffed by nurse practitioners or physician assistants that have been trained to treat common illnesses and complaints. They're usually fairly quick and inexpensive. However, if you have serious medical issues or chronic medical problems, these are probably not your best option  STD Testing - [place] [facility] [place], [facility], [address], [place], phone [phone] or 1-877-539-9860.  Monday - Friday, call for an appointment. - [place] [facility] [place], [facility], [address], [place], phone [phone] or 1-877-539-9860.  Monday - Friday, call for an appointment.  Abuse/Neglect: - [place] Child Abuse Hotline (336) 641-3795 - [place] Child Abuse Hotline 800-378-5315  (After Hours)  Emergency Shelter:  [facility] (336) 271-5985  Maternity Homes: - [facility] (336) 275-9566 - [facility] (704) 372-4663  MRSA Hotline #:   832-7006  [place] Resources  [facility] [place]  [employer] [place] [facility] [address]                 [address]         [address]                                               [place]                              [place] Phone:  [phone]                                  Phone:  [phone]                   Phone:  [phone]  [facility], [phone] - [place] Services - [facility]- 1-888-581-9988       -     [facility] in [place], [address],                                  336-349-4454, Insurance  [place] Child Abuse Hotline (336) 342-1394 or (336) 342-3537 (After Hours)   Behavioral Health Services  Substance Abuse Resources: - [facility]  336-882-2125 - [facility] 336-784-9470 - [facility] 336-285-9073 - [facility] 336-845-3988 - Residential & Outpatient Substance Abuse Program  800-659-3381  Psychological Services: - [facility]  [phone] - [facility]  [phone] - [facility], [address], [place], ACCESS LINE: 1-800-853-5163 or 336-641-4981, Http://www.[facility].com/services/adult.htm  Dental Assistance  If unable to pay or uninsured, contact:    [facility] or [place] [facility] to become qualified for the adult dental clinic.  Patients with Medicaid: [facility] [address], [phone], [phone]  If unable to pay, or uninsured, contact [facility] ([phone]) or [place] [facility] ([phone] in [place], [phone] in [place]) to  become qualified for the adult dental clinic  Other Low-Cost Community Dental Services: - [facility]- [address], [place], [place], [address], [phone], [address]. [address], 2nd and 4th Thursday of the month at 6:30am.  10 clients each day by appointment, can sometimes see walk-in patients if someone does not show for an appointment. - [facility]- [address], [place], [place], [address], [phone] - [facility]- [address], [place], [place], [address], [phone] - [place] [facility]- [phone] - [facility]- [phone] - [facility]- [phone] -  

## 2011-09-09 ENCOUNTER — Other Ambulatory Visit: Payer: Self-pay | Admitting: Internal Medicine

## 2011-09-22 ENCOUNTER — Emergency Department (HOSPITAL_COMMUNITY)
Admission: EM | Admit: 2011-09-22 | Discharge: 2011-09-22 | Disposition: A | Discharge status: Home / Self Care | Payer: Medicare Other | Attending: Emergency Medicine | Admitting: Emergency Medicine

## 2011-09-22 ENCOUNTER — Emergency Department (HOSPITAL_COMMUNITY): Payer: Medicare Other

## 2011-09-22 ENCOUNTER — Encounter (HOSPITAL_COMMUNITY): Payer: Self-pay | Admitting: *Deleted

## 2011-09-22 DIAGNOSIS — IMO0001 Reserved for inherently not codable concepts without codable children: Secondary | ICD-10-CM | POA: Insufficient documentation

## 2011-09-22 DIAGNOSIS — S32009A Unspecified fracture of unspecified lumbar vertebra, initial encounter for closed fracture: Secondary | ICD-10-CM | POA: Insufficient documentation

## 2011-09-22 DIAGNOSIS — J45909 Unspecified asthma, uncomplicated: Secondary | ICD-10-CM | POA: Insufficient documentation

## 2011-09-22 DIAGNOSIS — Z79899 Other long term (current) drug therapy: Secondary | ICD-10-CM | POA: Insufficient documentation

## 2011-09-22 DIAGNOSIS — Z7982 Long term (current) use of aspirin: Secondary | ICD-10-CM | POA: Insufficient documentation

## 2011-09-22 DIAGNOSIS — I10 Essential (primary) hypertension: Secondary | ICD-10-CM | POA: Insufficient documentation

## 2011-09-22 DIAGNOSIS — E119 Type 2 diabetes mellitus without complications: Secondary | ICD-10-CM | POA: Insufficient documentation

## 2011-09-22 DIAGNOSIS — Y9229 Other specified public building as the place of occurrence of the external cause: Secondary | ICD-10-CM | POA: Insufficient documentation

## 2011-09-22 DIAGNOSIS — M545 Low back pain, unspecified: Secondary | ICD-10-CM | POA: Insufficient documentation

## 2011-09-22 DIAGNOSIS — S32010A Wedge compression fracture of first lumbar vertebra, initial encounter for closed fracture: Secondary | ICD-10-CM

## 2011-09-22 DIAGNOSIS — F17200 Nicotine dependence, unspecified, uncomplicated: Secondary | ICD-10-CM | POA: Insufficient documentation

## 2011-09-22 DIAGNOSIS — W010XXA Fall on same level from slipping, tripping and stumbling without subsequent striking against object, initial encounter: Secondary | ICD-10-CM | POA: Insufficient documentation

## 2011-09-22 MED ORDER — HYDROCODONE-ACETAMINOPHEN 5-325 MG PO TABS
1.0000 | ORAL_TABLET | Freq: Once | ORAL | Status: AC
  Administered 2011-09-22: 1 via ORAL
  Filled 2011-09-22: qty 1

## 2011-09-22 MED ORDER — OXYCODONE-ACETAMINOPHEN 5-325 MG PO TABS
1.0000 | ORAL_TABLET | Freq: Once | ORAL | Status: AC
  Administered 2011-09-22: 1 via ORAL
  Filled 2011-09-22: qty 1

## 2011-09-22 MED ORDER — METHOCARBAMOL 500 MG PO TABS
500.0000 mg | ORAL_TABLET | Freq: Once | ORAL | Status: AC
  Administered 2011-09-22: 500 mg via ORAL
  Filled 2011-09-22: qty 1

## 2011-09-22 MED ORDER — CYCLOBENZAPRINE HCL 10 MG PO TABS: 10.0000 mg | ORAL_TABLET | Freq: Two times a day (BID) | ORAL | Status: AC | PRN

## 2011-09-22 MED ORDER — HYDROCODONE-ACETAMINOPHEN 5-325 MG PO TABS: 1.0000 | ORAL_TABLET | Freq: Four times a day (QID) | ORAL | Status: AC | PRN

## 2011-09-22 NOTE — ED Provider Notes (Signed)
History     CSN: [id number]  Arrival date & time 09/22/11  1428   None    3:26 PM HPI Vanessa Hull is a 56 y.o. female presenting with a complaint of a fall. She is sitting in the room surrounded by a suitcase and bags filled with clothing. States this morning she was in [place] when she slipped in the bathroom. Reports that She fell onto her buttocks. States since then has had pain in her lower back and buttocks. Reports painful to walk. Denies urinary symptoms, numbness, tingling, weakness, incontinence, abdominal pain, N/V, or head injury. Reports a history of frequent falls.  Patient is a 56 y.o. female presenting with fall. The history is provided by the patient.  Fall The fall occurred while walking. She landed on a hard floor. There was no blood loss. Point of impact: Buttocks. Pain location: lower back and buttocks. The pain is at a severity of 10/10. The pain is severe. She was ambulatory at the scene. There was no entrapment after the fall. There was no drug use involved in the accident. There was no alcohol use involved in the accident. Pertinent negatives include no numbness, no abdominal pain, no bowel incontinence, no nausea, no vomiting, no headaches and no loss of consciousness. The symptoms are aggravated by ambulation. She has tried nothing for the symptoms.    Past Medical History  Diagnosis Date  . Asthma   . Hypertension   . Diabetes mellitus     Past Surgical History  Procedure Date  . Abdominal hysterectomy   . Breast cyst excision   . Coronary angioplasty with stent placement     Entirely unclear. States this was in [place] and cannot give any accurate details of this    No family history on file.  History  Substance Use Topics  . Smoking status: Passive Smoker -- 5.0 packs/day for 5 years    Types: Cigarettes  . Smokeless tobacco: Not on file  . Alcohol Use: Yes     beer sometimes    OB History    Grav Para Term Preterm Abortions TAB SAB Ect Mult  Living                  Review of Systems  Constitutional: Negative for fatigue.  HENT: Negative for neck pain and neck stiffness.   Gastrointestinal: Negative for nausea, vomiting, abdominal pain and bowel incontinence.  Genitourinary: Negative for urgency, flank pain and pelvic pain.  Musculoskeletal: Negative for back pain.       Denies saddle anesthesias, or perineal numbness, urinary or bowel incontinence  Skin: Negative for wound.  Neurological: Negative for dizziness, loss of consciousness, weakness, light-headedness, numbness and headaches.  All other systems reviewed and are negative.    Allergies  Codeine  Home Medications   Current Outpatient Rx  Name Route Sig Dispense Refill  . ALBUTEROL SULFATE HFA 108 (90 BASE) MCG/ACT IN AERS Inhalation Inhale 2 puffs into the lungs every 6 (six) hours as needed for wheezing or shortness of breath. 8.5 g 0  . ALPRAZOLAM 0.25 MG PO TABS Oral Take 0.25 mg by mouth 3 (three) times daily as needed. anxiety    . ASPIRIN EC 81 MG PO TBEC Oral Take 81 mg by mouth daily.    Marland Kitchen DIPHENHYDRAMINE HCL 25 MG PO CAPS Oral Take 25 mg by mouth every 6 (six) hours as needed. allergies    . INSULIN ISOPHANE & REGULAR (70-30) 100 UNIT/ML [place] SUSP Subcutaneous Inject  10-15 Units into the skin See admin instructions. Use 15 units in AM and 10 units in PM    . LISINOPRIL 10 MG PO TABS Oral Take 1 tablet (10 mg total) by mouth daily. 30 tablet 0  . METFORMIN HCL 500 MG PO TABS Oral Take 1 tablet (500 mg total) by mouth 2 (two) times daily with a meal. 60 tablet 0  . OMEPRAZOLE 40 MG PO CPDR Oral Take 1 capsule (40 mg total) by mouth 2 (two) times daily. 60 capsule 0    There were no vitals taken for this visit.  Physical Exam  Vitals reviewed. Constitutional: She is oriented to person, place, and time. Vital signs are normal. She appears well-developed and well-nourished.  HENT:  Head: Normocephalic and atraumatic.  Eyes: Conjunctivae are normal.  Pupils are equal, round, and reactive to light.  Neck: Normal range of motion. Neck supple.  Cardiovascular: Normal rate, regular rhythm and normal heart sounds.  Exam reveals no friction rub.   No murmur heard. Pulmonary/Chest: Effort normal and breath sounds normal. She has no wheezes. She has no rhonchi. She has no rales. She exhibits no tenderness.  Abdominal: Soft. Bowel sounds are normal. She exhibits no distension and no mass. There is no tenderness. There is no rebound and no guarding.  Musculoskeletal: Normal range of motion.       Right hip: She exhibits tenderness. She exhibits normal range of motion, normal strength, no bony tenderness, no swelling, no deformity and no laceration.       Lumbar back: She exhibits tenderness. She exhibits normal range of motion, no bony tenderness, no swelling, no edema, no laceration, no pain, no spasm and normal pulse.       Back:       Legs:      Reports pain with palpation of entire lower back. No contusions or deformity. Full ROM of lower back but slow. Normal distal pulses and sensation. Neg SLR. Full ROM of Bilateral hips. No rotation of shortening.   Neurological: She is alert and oriented to person, place, and time. Coordination normal.  Skin: Skin is warm and dry. No rash noted. No erythema. No pallor.    ED Course  Procedures   Dg Lumbar Spine Complete  09/22/2011  *RADIOLOGY REPORT*  Clinical Data: Low back pain post fall  LUMBAR SPINE - COMPLETE 4+ VIEW  Comparison: Lateral view of the chest 09/08/2011  Findings: Five views of the lumbar spine submitted.  There is diffuse osteopenia.  Facet degenerative changes noted L4 and L5 level.  There is mild compression fracture superior endplate of the L1 vertebral body.  Disc spaces are preserved.  IMPRESSION: Diffuse osteopenia.  Mild compression fracture superior endplate of the L1 vertebral body.  Degenerative changes with facet hypertrophic changes L4 and L5 level.  Original Report  Authenticated By: Natasha Mead, M.D.   Dg Hip Bilateral W/pelvis  09/22/2011  *RADIOLOGY REPORT*  Clinical Data: 56 year old female status post fall with pain.  BILATERAL HIP WITH PELVIS - 4+ VIEW  Comparison: None.  Findings: Femoral heads normally located.  Joint spaces are preserved.  Pubic rami appear intact.  No acute pelvic fracture identified.  Pelvic phleboliths.  Sacral ala and SI joints within normal limits.  Right proximal femur appears intact and within normal limits.  Left proximal femur appears intact and within normal limits.  IMPRESSION: No acute fracture or dislocation identified about the bilateral hips or pelvis.  Original Report Authenticated By: Harley Hallmark, M.D.  MDM  Patient has a mild compression fracture of her L1 will treat with analgesics and muscle relaxants. Provided patient with referral for orthopedics. Pt voices understanding and is ready for d/c      Thomasene Lot, PA-C 09/22/11 1639

## 2011-09-22 NOTE — ED Notes (Signed)
Pt fell in bathroom at [facility] this am. R hip pain radiating to lower back. Pain 10/10, pt ambulatory on scene.

## 2011-09-22 NOTE — Discharge Instructions (Signed)
Back, Compression Fracture A compression fracture happens when a force is put upon the length of your spine. Slipping and falling on your bottom are examples of such a force. When this happens, sometimes the force is great enough to compress the building blocks (vertebral bodies) of your spine. Although this causes a lot of pain, this can usually be treated at home, unless your caregiver feels hospitalization is needed for pain control. Your backbone (spinal column) is made up of 24 main vertebral bodies in addition to the sacrum and coccyx (see illustration). These are held together by tough fibrous tissues (ligaments) and by support of your muscles. Nerve roots pass through the openings between the vertebrae. A sudden wrenching move, injury, or a fall may cause a compression fracture of one of the vertebral bodies. This may result in back pain or spread of pain into the belly (abdomen), the buttocks, and down the leg into the foot. Pain may also be created by muscle spasm alone. Large studies have been undertaken to determine the best possible course of action to help your back following injury and also to prevent future problems. The recommendations are as follows. FOLLOWING A COMPRESSION FRACTURE: Do the following only if advised by your caregiver.   When allowed to return to regular activities, avoid a sedentary life style. Actively exercise. Sporadic weekend binges of tennis, racquetball, water skiing, may actually aggravate or create problems, especially if you are not in condition for that activity.   Avoid sports requiring sudden body movements until you are in condition for them. Swimming and walking are safer activities.   Maintain good posture.   Avoid obesity.  FOLLOWING ACUTE (SUDDEN) INJURY:  Only take over-the-counter or prescription medicines for pain, discomfort, or fever as directed by your caregiver.   Use bed rest for only the most extreme acute episode. Prolonged bed rest may  aggravate your condition. Ice used for acute conditions is effective. Use a large plastic bag filled with ice. Wrap it in a towel. This also provides excellent pain relief. This may be continuous. Or use it for 30 minutes every 2 hours during acute phase, then as needed. Heat for 30 minutes prior to activities is helpful.   As soon as the acute phase (the time when your back is too painful for you to do normal activities) is over, it is important to resume normal activities and work Arboriculturist. Back injuries can cause potentially marked changes in lifestyle. So it is important to attack these problems aggressively.   See your caregiver for continued problems. He or she can help or refer you for appropriate exercises, physical therapy and work hardening if needed.   If you are given narcotic medications for your condition, for the next 24 hours DO NOT:   Drive   Operate machinery or power tools.   Sign legal documents.   DO NOT drink alcohol, take sleeping pills or other medications that may interfere with treatment.  If your caregiver has given you a follow-up appointment, it is very important to keep that appointment. Not keeping the appointment could result in a chronic or permanent injury, pain, and disability. If there is any problem keeping the appointment, you must call back to this facility for assistance.  SEEK IMMEDIATE MEDICAL CARE IF:  You develop numbness, tingling, weakness, or problems with the use of your arms or legs.   You develop severe back pain not relieved with medications.   You have changes in bowel or bladder control.  You have increasing pain in any areas of the body.  Document Released: 03/23/2005 Document Revised: 03/12/2011 Document Reviewed: 10/26/2007 [facility] Patient Information 2012 [place], [place].

## 2011-09-22 NOTE — ED Provider Notes (Signed)
Medical screening examination/treatment/procedure(s) were performed by non-physician practitioner and as supervising physician I was immediately available for consultation/collaboration.   Rolan Bucco, MD 09/22/11 1721

## 2012-01-15 DIAGNOSIS — M940 Chondrocostal junction syndrome [Tietze]: Principal | ICD-10-CM | POA: Insufficient documentation

## 2012-01-15 DIAGNOSIS — R062 Wheezing: Secondary | ICD-10-CM | POA: Insufficient documentation

## 2012-01-15 DIAGNOSIS — Z794 Long term (current) use of insulin: Secondary | ICD-10-CM | POA: Insufficient documentation

## 2012-01-15 DIAGNOSIS — E119 Type 2 diabetes mellitus without complications: Secondary | ICD-10-CM | POA: Insufficient documentation

## 2012-01-15 DIAGNOSIS — I10 Essential (primary) hypertension: Secondary | ICD-10-CM | POA: Insufficient documentation

## 2012-01-15 DIAGNOSIS — Z5900 Homelessness unspecified: Secondary | ICD-10-CM | POA: Insufficient documentation

## 2012-01-15 DIAGNOSIS — J45909 Unspecified asthma, uncomplicated: Secondary | ICD-10-CM | POA: Insufficient documentation

## 2012-01-15 DIAGNOSIS — F17200 Nicotine dependence, unspecified, uncomplicated: Secondary | ICD-10-CM | POA: Insufficient documentation

## 2012-01-15 DIAGNOSIS — Z79899 Other long term (current) drug therapy: Secondary | ICD-10-CM | POA: Insufficient documentation

## 2012-01-15 DIAGNOSIS — Z7982 Long term (current) use of aspirin: Secondary | ICD-10-CM | POA: Insufficient documentation

## 2012-01-15 NOTE — ED Notes (Signed)
The pt just arrived here from [place] [facility] on the bus.  She has had chest  And back pain since yesterday.  She is also c/o dizziness.

## 2012-01-16 ENCOUNTER — Observation Stay (HOSPITAL_COMMUNITY)
Admission: EM | Admit: 2012-01-16 | Discharge: 2012-01-18 | Disposition: A | Discharge status: Home / Self Care | Payer: Medicare Other | Attending: Internal Medicine | Admitting: Internal Medicine

## 2012-01-16 ENCOUNTER — Emergency Department (HOSPITAL_COMMUNITY): Payer: Medicare Other

## 2012-01-16 ENCOUNTER — Encounter (HOSPITAL_COMMUNITY): Payer: Self-pay | Admitting: *Deleted

## 2012-01-16 DIAGNOSIS — R0789 Other chest pain: Secondary | ICD-10-CM

## 2012-01-16 DIAGNOSIS — I10 Essential (primary) hypertension: Secondary | ICD-10-CM | POA: Diagnosis present

## 2012-01-16 DIAGNOSIS — Z590 Homelessness: Secondary | ICD-10-CM

## 2012-01-16 DIAGNOSIS — R079 Chest pain, unspecified: Secondary | ICD-10-CM | POA: Diagnosis present

## 2012-01-16 DIAGNOSIS — E119 Type 2 diabetes mellitus without complications: Secondary | ICD-10-CM

## 2012-01-16 LAB — URINE MICROSCOPIC-ADD ON

## 2012-01-16 LAB — COMPREHENSIVE METABOLIC PANEL
ALT: 22 U/L (ref 0–35)
AST: 19 U/L (ref 0–37)
Albumin: 3.5 g/dL (ref 3.5–5.2)
CO2: 24 mEq/L (ref 19–32)
Calcium: 9.4 mg/dL (ref 8.4–10.5)
Creatinine, Ser: 0.9 mg/dL (ref 0.50–1.10)
GFR calc non Af Amer: 69 mL/min — ABNORMAL LOW (ref >90)
Sodium: 138 mEq/L (ref 135–145)
Total Protein: 7.3 g/dL (ref 6.0–8.3)

## 2012-01-16 LAB — CBC WITH DIFFERENTIAL/PLATELET
Basophils Absolute: 0 10*3/uL (ref 0.0–0.1)
Basophils Relative: 0 % (ref 0–1)
Eosinophils Relative: 2 % (ref 0–5)
Lymphocytes Relative: 35 % (ref 12–46)
MCHC: 33 g/dL (ref 30.0–36.0)
MCV: 83.6 fL (ref 78.0–100.0)
Monocytes Absolute: 0.4 10*3/uL (ref 0.1–1.0)
Platelets: 283 10*3/uL (ref 150–400)
RDW: 14.7 % (ref 11.5–15.5)
WBC: 6.5 10*3/uL (ref 4.0–10.5)

## 2012-01-16 LAB — URINALYSIS, ROUTINE W REFLEX MICROSCOPIC
Glucose, UA: >1000 mg/dL — AB
Leukocytes, UA: NEGATIVE
Protein, ur: >300 mg/dL — AB
Specific Gravity, Urine: 1.034 — ABNORMAL HIGH (ref 1.005–1.030)
pH: 5 (ref 5.0–8.0)

## 2012-01-16 LAB — BASIC METABOLIC PANEL
Chloride: 106 mEq/L (ref 96–112)
GFR calc Af Amer: 90 mL/min — ABNORMAL LOW (ref >90)
Potassium: 3.4 mEq/L — ABNORMAL LOW (ref 3.5–5.1)

## 2012-01-16 LAB — CBC
HCT: 34.7 % — ABNORMAL LOW (ref 36.0–46.0)
Platelets: 234 10*3/uL (ref 150–400)
RDW: 14.5 % (ref 11.5–15.5)
WBC: 6.4 10*3/uL (ref 4.0–10.5)

## 2012-01-16 LAB — TROPONIN I
Troponin I: <0.3 ng/mL (ref <0.30)
Troponin I: <0.3 ng/mL (ref <0.30)

## 2012-01-16 LAB — PRO B NATRIURETIC PEPTIDE: Pro B Natriuretic peptide (BNP): 175.1 pg/mL — ABNORMAL HIGH (ref 0–125)

## 2012-01-16 LAB — MRSA PCR SCREENING: MRSA by PCR: NEGATIVE

## 2012-01-16 LAB — GLUCOSE, CAPILLARY
Glucose-Capillary: 209 mg/dL — ABNORMAL HIGH (ref 70–99)
Glucose-Capillary: 249 mg/dL — ABNORMAL HIGH (ref 70–99)

## 2012-01-16 MED ORDER — IBUPROFEN 600 MG PO TABS
600.0000 mg | ORAL_TABLET | Freq: Three times a day (TID) | ORAL | Status: DC
  Filled 2012-01-16 (×3): qty 1

## 2012-01-16 MED ORDER — SENNOSIDES-DOCUSATE SODIUM 8.6-50 MG PO TABS
1.0000 | ORAL_TABLET | Freq: Every evening | ORAL | Status: DC | PRN
  Filled 2012-01-16: qty 1

## 2012-01-16 MED ORDER — INSULIN ASPART 100 UNIT/ML ~~LOC~~ SOLN
0.0000 [IU] | Freq: Three times a day (TID) | SUBCUTANEOUS | Status: DC
  Administered 2012-01-16: 3 [IU] via SUBCUTANEOUS
  Administered 2012-01-16 – 2012-01-17 (×3): 5 [IU] via SUBCUTANEOUS
  Administered 2012-01-17 (×2): 3 [IU] via SUBCUTANEOUS
  Administered 2012-01-18: 5 [IU] via SUBCUTANEOUS

## 2012-01-16 MED ORDER — ONDANSETRON HCL 4 MG/2ML IJ SOLN: 4.0000 mg | Freq: Four times a day (QID) | INTRAMUSCULAR | Status: DC | PRN

## 2012-01-16 MED ORDER — MOXIFLOXACIN HCL IN NACL 400 MG/250ML IV SOLN
400.0000 mg | INTRAVENOUS | Status: DC
  Administered 2012-01-17 – 2012-01-18 (×2): 400 mg via INTRAVENOUS
  Filled 2012-01-16 (×3): qty 250

## 2012-01-16 MED ORDER — KETOROLAC TROMETHAMINE 30 MG/ML IJ SOLN
INTRAMUSCULAR | Status: AC
  Filled 2012-01-16: qty 1

## 2012-01-16 MED ORDER — KETOROLAC TROMETHAMINE 30 MG/ML IJ SOLN
30.0000 mg | Freq: Once | INTRAMUSCULAR | Status: AC
  Administered 2012-01-16: 30 mg via INTRAVENOUS

## 2012-01-16 MED ORDER — ACETAMINOPHEN 325 MG PO TABS
650.0000 mg | ORAL_TABLET | Freq: Four times a day (QID) | ORAL | Status: DC | PRN
  Administered 2012-01-16 – 2012-01-18 (×4): 650 mg via ORAL
  Filled 2012-01-16: qty 1
  Filled 2012-01-16 (×4): qty 2

## 2012-01-16 MED ORDER — SODIUM CHLORIDE 0.9 % IJ SOLN: 3.0000 mL | INTRAMUSCULAR | Status: DC | PRN

## 2012-01-16 MED ORDER — IPRATROPIUM BROMIDE 0.02 % IN SOLN
0.5000 mg | RESPIRATORY_TRACT | Status: DC | PRN
  Administered 2012-01-16 – 2012-01-18 (×5): 0.5 mg via RESPIRATORY_TRACT
  Filled 2012-01-16 (×5): qty 2.5

## 2012-01-16 MED ORDER — MORPHINE SULFATE 4 MG/ML IJ SOLN
4.0000 mg | Freq: Once | INTRAMUSCULAR | Status: AC
  Administered 2012-01-16: 4 mg via INTRAVENOUS
  Filled 2012-01-16: qty 1

## 2012-01-16 MED ORDER — ENOXAPARIN SODIUM 40 MG/0.4ML ~~LOC~~ SOLN
40.0000 mg | SUBCUTANEOUS | Status: DC
Start: 2012-01-16 — End: 2012-01-18
  Administered 2012-01-16: 40 mg via SUBCUTANEOUS
  Filled 2012-01-16 (×3): qty 0.4

## 2012-01-16 MED ORDER — MOXIFLOXACIN HCL IN NACL 400 MG/250ML IV SOLN
400.0000 mg | Freq: Once | INTRAVENOUS | Status: AC
  Administered 2012-01-16: 400 mg via INTRAVENOUS
  Filled 2012-01-16: qty 250

## 2012-01-16 MED ORDER — ASPIRIN 81 MG PO CHEW: 324.0000 mg | CHEWABLE_TABLET | Freq: Once | ORAL | Status: DC

## 2012-01-16 MED ORDER — ALBUTEROL SULFATE (5 MG/ML) 0.5% IN NEBU
2.5000 mg | INHALATION_SOLUTION | RESPIRATORY_TRACT | Status: DC | PRN
  Administered 2012-01-16 – 2012-01-18 (×5): 2.5 mg via RESPIRATORY_TRACT
  Filled 2012-01-16 (×5): qty 0.5

## 2012-01-16 MED ORDER — ONDANSETRON HCL 4 MG PO TABS
4.0000 mg | ORAL_TABLET | Freq: Four times a day (QID) | ORAL | Status: DC | PRN
  Administered 2012-01-16: 4 mg via ORAL
  Filled 2012-01-16: qty 1

## 2012-01-16 MED ORDER — ACETAMINOPHEN 650 MG RE SUPP: 650.0000 mg | Freq: Four times a day (QID) | RECTAL | Status: DC | PRN

## 2012-01-16 MED ORDER — ONDANSETRON HCL 4 MG/2ML IJ SOLN
4.0000 mg | Freq: Once | INTRAMUSCULAR | Status: AC
  Administered 2012-01-16: 4 mg via INTRAVENOUS
  Filled 2012-01-16: qty 2

## 2012-01-16 MED ORDER — ZOLPIDEM TARTRATE 5 MG PO TABS: 5.0000 mg | ORAL_TABLET | Freq: Every evening | ORAL | Status: DC | PRN

## 2012-01-16 MED ORDER — INSULIN ASPART 100 UNIT/ML ~~LOC~~ SOLN: 0.0000 [IU] | Freq: Every day | SUBCUTANEOUS | Status: DC

## 2012-01-16 MED ORDER — SODIUM CHLORIDE 0.9 % IJ SOLN
3.0000 mL | Freq: Two times a day (BID) | INTRAMUSCULAR | Status: DC
  Administered 2012-01-16 – 2012-01-17 (×4): 3 mL via INTRAVENOUS

## 2012-01-16 MED ORDER — PANTOPRAZOLE SODIUM 40 MG PO TBEC
40.0000 mg | DELAYED_RELEASE_TABLET | Freq: Two times a day (BID) | ORAL | Status: DC
  Administered 2012-01-16 – 2012-01-18 (×4): 40 mg via ORAL
  Filled 2012-01-16 (×4): qty 1

## 2012-01-16 MED ORDER — NITROGLYCERIN 0.4 MG SL SUBL
0.4000 mg | SUBLINGUAL_TABLET | SUBLINGUAL | Status: DC | PRN
  Administered 2012-01-16: 0.4 mg via SUBLINGUAL
  Filled 2012-01-16: qty 25

## 2012-01-16 MED ORDER — CLONIDINE HCL 0.1 MG PO TABS
0.1000 mg | ORAL_TABLET | Freq: Four times a day (QID) | ORAL | Status: DC | PRN
  Administered 2012-01-16 (×2): 0.1 mg via ORAL
  Filled 2012-01-16 (×3): qty 1

## 2012-01-16 MED ORDER — HYDROCODONE-ACETAMINOPHEN 5-325 MG PO TABS: 1.0000 | ORAL_TABLET | ORAL | Status: DC | PRN

## 2012-01-16 MED ORDER — ASPIRIN 81 MG PO CHEW
324.0000 mg | CHEWABLE_TABLET | Freq: Once | ORAL | Status: AC
  Administered 2012-01-16: 324 mg via ORAL
  Filled 2012-01-16: qty 4

## 2012-01-16 MED ORDER — ALUM & MAG HYDROXIDE-SIMETH 200-200-20 MG/5ML PO SUSP
30.0000 mL | Freq: Four times a day (QID) | ORAL | Status: DC | PRN
  Administered 2012-01-16 – 2012-01-17 (×2): 30 mL via ORAL
  Filled 2012-01-16 (×2): qty 30

## 2012-01-16 MED ORDER — NICOTINE 14 MG/24HR TD PT24
14.0000 mg | MEDICATED_PATCH | Freq: Every day | TRANSDERMAL | Status: DC
  Administered 2012-01-16 – 2012-01-18 (×3): 14 mg via TRANSDERMAL
  Filled 2012-01-16 (×3): qty 1

## 2012-01-16 MED ORDER — ASPIRIN EC 325 MG PO TBEC
325.0000 mg | DELAYED_RELEASE_TABLET | Freq: Every day | ORAL | Status: DC
  Filled 2012-01-16: qty 1

## 2012-01-16 MED ORDER — IBUPROFEN 600 MG PO TABS
600.0000 mg | ORAL_TABLET | Freq: Three times a day (TID) | ORAL | Status: DC
  Administered 2012-01-16 – 2012-01-18 (×8): 600 mg via ORAL
  Filled 2012-01-16 (×10): qty 1

## 2012-01-16 MED ORDER — NITROGLYCERIN 2 % TD OINT
1.0000 [in_us] | TOPICAL_OINTMENT | Freq: Four times a day (QID) | TRANSDERMAL | Status: DC
  Administered 2012-01-16 – 2012-01-18 (×7): 1 [in_us] via TOPICAL
  Filled 2012-01-16: qty 1
  Filled 2012-01-16: qty 30

## 2012-01-16 NOTE — ED Provider Notes (Signed)
History     CSN: [id number]  Arrival date & time 01/15/12  2350   First MD Initiated Contact with Patient 01/16/12 0155      Chief Complaint  Patient presents with  . chest and back pain     (Consider location/radiation/quality/duration/timing/severity/associated sxs/prior treatment) HPI Hx per PT. L sided chest pain and pressure with associated back pain, states she rode the bus from [place] and developed CP in route. Has associated SOB. No h/o DVT or PE. No leg pain or swelling. states she had a stent placed in [place] in 2008. Takes ASA daily, is treated for HTN and DM. Mod to severe pain without known aggrevating or alleviating factors Past Medical History  Diagnosis Date  . Asthma   . Hypertension   . Diabetes mellitus     Past Surgical History  Procedure Date  . Abdominal hysterectomy   . Breast cyst excision   . Coronary angioplasty with stent placement     Entirely unclear. States this was in [place] and cannot give any accurate details of this    No family history on file.  History  Substance Use Topics  . Smoking status: Passive Smoke Exposure - Never Smoker -- 5.0 packs/day for 5 years    Types: Cigarettes  . Smokeless tobacco: Not on file  . Alcohol Use: Yes     beer sometimes    OB History    Grav Para Term Preterm Abortions TAB SAB Ect Mult Living                  Review of Systems  Constitutional: Negative for fever and chills.  HENT: Negative for neck pain and neck stiffness.   Eyes: Negative for pain.  Respiratory: Negative for cough and wheezing.   Cardiovascular: Positive for chest pain.  Gastrointestinal: Negative for abdominal pain.  Genitourinary: Negative for dysuria.  Musculoskeletal: Negative for back pain.  Skin: Negative for rash.  Neurological: Negative for headaches.  All other systems reviewed and are negative.    Allergies  Codeine  Home Medications   Current Outpatient Rx  Name Route Sig Dispense Refill    . ALBUTEROL SULFATE HFA 108 (90 BASE) MCG/ACT IN AERS Inhalation Inhale 2 puffs into the lungs every 6 (six) hours as needed for wheezing or shortness of breath. 8.5 g 0  . ALPRAZOLAM 0.25 MG PO TABS Oral Take 0.25 mg by mouth 3 (three) times daily as needed. anxiety    . ASPIRIN EC 81 MG PO TBEC Oral Take 81 mg by mouth daily.    Marland Kitchen DIPHENHYDRAMINE HCL 25 MG PO CAPS Oral Take 25 mg by mouth every 6 (six) hours as needed. allergies    . INSULIN ISOPHANE & REGULAR (70-30) 100 UNIT/ML [place] SUSP Subcutaneous Inject 10-15 Units into the skin See admin instructions. Use 15 units in AM and 10 units in PM    . LISINOPRIL 10 MG PO TABS Oral Take 1 tablet (10 mg total) by mouth daily. 30 tablet 0  . METFORMIN HCL 500 MG PO TABS Oral Take 1 tablet (500 mg total) by mouth 2 (two) times daily with a meal. 60 tablet 0  . OMEPRAZOLE 40 MG PO CPDR Oral Take 1 capsule (40 mg total) by mouth 2 (two) times daily. 60 capsule 0    BP 209/89  Pulse 72  Temp 97.7 F (36.5 C) (Oral)  Resp 16  SpO2 100%  Physical Exam  Constitutional: She is oriented to person, place, and  time. She appears well-developed and well-nourished.  HENT:  Head: Normocephalic and atraumatic.  Eyes: Conjunctivae normal and EOM are normal. Pupils are equal, round, and reactive to light.  Neck: Trachea normal. Neck supple. No thyromegaly present.  Cardiovascular: Normal rate, regular rhythm, S1 normal, S2 normal and normal pulses.     No systolic murmur is present   No diastolic murmur is present  Pulses:      Radial pulses are 2+ on the right side, and 2+ on the left side.  Pulmonary/Chest: Effort normal and breath sounds normal. She has no wheezes. She has no rhonchi. She has no rales. She exhibits no tenderness.  Abdominal: Soft. Normal appearance and bowel sounds are normal. There is no tenderness. There is no CVA tenderness and negative Murphy's sign.  Musculoskeletal:       BLE:s Calves nontender, no cords or erythema, negative  Homans sign  Neurological: She is alert and oriented to person, place, and time. She has normal strength. No cranial nerve deficit or sensory deficit. GCS eye subscore is 4. GCS verbal subscore is 5. GCS motor subscore is 6.  Skin: Skin is warm and dry. No rash noted. She is not diaphoretic.  Psychiatric: Her speech is normal.       Cooperative and appropriate    ED Course  Procedures (including critical care time)   Results for orders placed during the hospital encounter of 01/16/12  CBC WITH DIFFERENTIAL      Component Value Range   WBC 6.5  4.0 - 10.5 K/uL   RBC 4.50  3.87 - 5.11 MIL/uL   Hemoglobin 12.4  12.0 - 15.0 g/dL   HCT 82.9  56.2 - 13.0 %   MCV 83.6  78.0 - 100.0 fL   MCH 27.6  26.0 - 34.0 pg   MCHC 33.0  30.0 - 36.0 g/dL   RDW 86.5  78.4 - 69.6 %   Platelets 283  150 - 400 K/uL   Neutrophils Relative 57  43 - 77 %   Neutro Abs 3.7  1.7 - 7.7 K/uL   Lymphocytes Relative 35  12 - 46 %   Lymphs Abs 2.3  0.7 - 4.0 K/uL   Monocytes Relative 6  3 - 12 %   Monocytes Absolute 0.4  0.1 - 1.0 K/uL   Eosinophils Relative 2  0 - 5 %   Eosinophils Absolute 0.1  0.0 - 0.7 K/uL   Basophils Relative 0  0 - 1 %   Basophils Absolute 0.0  0.0 - 0.1 K/uL  COMPREHENSIVE METABOLIC PANEL      Component Value Range   Sodium 138  135 - 145 mEq/L   Potassium 3.6  3.5 - 5.1 mEq/L   Chloride 102  96 - 112 mEq/L   CO2 24  19 - 32 mEq/L   Glucose, Bld 337 (*) 70 - 99 mg/dL   BUN 18  6 - 23 mg/dL   Creatinine, Ser 2.95  0.50 - 1.10 mg/dL   Calcium 9.4  8.4 - 28.4 mg/dL   Total Protein 7.3  6.0 - 8.3 g/dL   Albumin 3.5  3.5 - 5.2 g/dL   AST 19  0 - 37 U/L   ALT 22  0 - 35 U/L   Alkaline Phosphatase 101  39 - 117 U/L   Total Bilirubin 0.2 (*) 0.3 - 1.2 mg/dL   GFR calc non Af Amer 69 (*) >90 mL/min   GFR calc Af Amer 80 (*) >90  mL/min  TROPONIN I      Component Value Range   Troponin I <0.30  <0.30 ng/mL  URINALYSIS, ROUTINE W REFLEX MICROSCOPIC      Component Value Range   Color,  Urine YELLOW  YELLOW   APPearance CLEAR  CLEAR   Specific Gravity, Urine 1.034 (*) 1.005 - 1.030   pH 5.0  5.0 - 8.0   Glucose, UA >1000 (*) NEGATIVE mg/dL   Hgb urine dipstick MODERATE (*) NEGATIVE   Bilirubin Urine NEGATIVE  NEGATIVE   Ketones, ur NEGATIVE  NEGATIVE mg/dL   Protein, ur >161 (*) NEGATIVE mg/dL   Urobilinogen, UA 0.2  0.0 - 1.0 mg/dL   Nitrite NEGATIVE  NEGATIVE   Leukocytes, UA NEGATIVE  NEGATIVE  URINE MICROSCOPIC-ADD ON      Component Value Range   Squamous Epithelial / LPF RARE  RARE   WBC, UA 0-2  <3 WBC/hpf   RBC / HPF 3-6  <3 RBC/hpf   Bacteria, UA FEW (*) RARE   Casts HYALINE CASTS (*) NEGATIVE  D-DIMER, QUANTITATIVE      Component Value Range   D-Dimer, Quant 0.32  0.00 - 0.48 ug/mL-FEU   Dg Chest Portable 1 View  01/16/2012  *RADIOLOGY REPORT*  Clinical Data: Chest pain, asthma  PORTABLE CHEST - 1 VIEW  Comparison: 09/08/2011  Findings: Increased interstitial markings, right lung predominant, possibly reflecting asymmetric edema or infection. No pleural effusion or pneumothorax.  Mild cardiomegaly.  IMPRESSION: Increased interstitial markings, possibly reflecting asymmetric edema or infection.   Original Report Authenticated By: Charline Bills, M.D.      Date: 01/16/2012  Rate: 84  Rhythm: normal sinus rhythm  QRS Axis: normal  Intervals: normal  ST/T Wave abnormalities: nonspecific ST changes  Conduction Disutrbances:none  Narrative Interpretation:   Old EKG Reviewed: none available  ASA. NTG. IV narcotics.   3:52 AM d/w Dr Joneen Roach - will eval in the Ed for admit CP  MDM   CP with stated h/o ACS and stent. No local cardiologist. Neg d-dimer. ECG, labs and CXR reviewed. Medications provided. MED c/s for admit.        Sunnie Nielsen, MD 01/16/12 [phone]

## 2012-01-16 NOTE — Progress Notes (Signed)
Spoke with dr. Jomarie Longs pt c/o of 8/10 chest pain, nothing is helping pt stated. Cardiac enzymes x2 are negative, no ekg changes noted. Md aware, pt will be started on ibuprofen, md stated pt can move out to the floor, does not need to be in critical care.

## 2012-01-16 NOTE — H&P (Addendum)
PCP:   None   Chief Complaint:  Chest pain  HPI: This is a 56 year old female who states today she developed severe chest pains or radiate to her back it made her dizzy. She states that the pain radiates to the left and right of her chest, she states she is wheezing, she has chest pain with deep breathing, a mildly productive cough with yellow phlegm. She reports she has fever, chills, nausea, vomiting, palpitations, diaphoresis, orthopnea and lower extremity edema. Her symptoms are similar to when she had an MI before. Her pain ranked 10 out of 10, currently 9/10. She describes the pain as a mix between sharp and dull and continuous since yesterday. History provided by patient who appears comfortable in the ER.  Review of Systems:  The patient denies anorexia, fever, weight loss,, vision loss, decreased hearing, hoarseness, syncope, dyspnea on exertion, peripheral edema, balance deficits, hemoptysis, abdominal pain, melena, hematochezia, severe indigestion/heartburn, hematuria, incontinence, genital sores, muscle weakness, suspicious skin lesions, transient blindness, difficulty walking, depression, unusual weight change, abnormal bleeding, enlarged lymph nodes, angioedema, and breast masses.  Past Medical History: Past Medical History  Diagnosis Date  . Asthma   . Hypertension   . Diabetes mellitus    Past Surgical History  Procedure Date  . Abdominal hysterectomy   . Breast cyst excision   . Coronary angioplasty with stent placement     Entirely unclear. States this was in [place] and cannot give any accurate details of this    Medications: Prior to Admission medications   Medication Sig Start Date End Date Taking? Authorizing Provider  albuterol (PROVENTIL HFA;VENTOLIN HFA) 108 (90 BASE) MCG/ACT inhaler Inhale 2 puffs into the lungs every 6 (six) hours as needed for wheezing or shortness of breath. 09/08/11 09/07/12 Yes Celene Kras, MD  aspirin EC 81 MG tablet Take 81 mg by mouth  daily.   Yes Historical Provider, MD  insulin NPH-insulin regular (NOVOLIN 70/30) (70-30) 100 UNIT/ML injection Inject 10-15 Units into the skin See admin instructions. Use 15 units in AM and 10 units in PM   Yes Historical Provider, MD    Allergies:   Allergies  Allergen Reactions  . Codeine Itching    Social History:  reports that she has been passively smoking Cigarettes.  She has a 25 pack-year smoking history. She does not have any smokeless tobacco history on file. She reports that she drinks alcohol. She reports that she does not use illicit drugs.  Family History: Leukemia  Physical Exam: Filed Vitals:   01/15/12 2358 01/16/12 0148 01/16/12 0306 01/16/12 0406  BP: 200/83 209/89 185/73 161/52  Pulse: 88 72 65 74  Temp: 98.2 F (36.8 C) 97.7 F (36.5 C)    TempSrc: Oral Oral    Resp: 20 16 16    SpO2: 97% 100%  100%    General:  Alert and oriented times three, well developed and nourished, no acute distress Eyes: PERRLA, pink conjunctiva, no scleral icterus ENT: Moist oral mucosa, neck supple, no thyromegaly Lungs: clear to ascultation, no wheeze, no crackles, no use of accessory muscles Cardiovascular: regular rate and rhythm, no regurgitation, no gallops, no murmurs. No carotid bruits, no JVD Abdomen: soft, positive BS, non-tender, non-distended, no organomegaly, not an acute abdomen GU: not examined Neuro: CN II - XII grossly intact, sensation intact Musculoskeletal: strength 5/5 all extremities, no clubbing, cyanosis or edema, very reproducible chest wall pain all over her torso Skin: no rash, no subcutaneous crepitation, no decubitus   Labs  on Admission:   [facility] 01/16/12 0019  NA 138  K 3.6  CL 102  CO2 24  GLUCOSE 337*  BUN 18  CREATININE 0.90  CALCIUM 9.4  MG --  PHOS --    Basename 01/16/12 0019  AST 19  ALT 22  ALKPHOS 101  BILITOT 0.2*  PROT 7.3  ALBUMIN 3.5   No results found for this basename: LIPASE:2,AMYLASE:2 in the last 72  hours  Basename 01/16/12 0019  WBC 6.5  NEUTROABS 3.7  HGB 12.4  HCT 37.6  MCV 83.6  PLT 283    Basename 01/16/12 0019  CKTOTAL --  CKMB --  CKMBINDEX --  TROPONINI <0.30   No components found with this basename: POCBNP:3  Basename 01/16/12 0216  DDIMER 0.32   No results found for this basename: HGBA1C:2 in the last 72 hours No results found for this basename: CHOL:2,HDL:2,LDLCALC:2,TRIG:2,CHOLHDL:2,LDLDIRECT:2 in the last 72 hours No results found for this basename: TSH,T4TOTAL,FREET3,T3FREE,THYROIDAB in the last 72 hours No results found for this basename: VITAMINB12:2,FOLATE:2,FERRITIN:2,TIBC:2,IRON:2,RETICCTPCT:2 in the last 72 hours  Micro Results:  Results for [name], Hull (MRN [id number]) as of 01/16/2012 04:34  Ref. Range 01/16/2012 00:03  Color, Urine Latest Range: YELLOW  YELLOW  APPearance Latest Range: CLEAR  CLEAR  Specific Gravity, Urine Latest Range: 1.005-1.030  1.034 (H)  pH Latest Range: 5.0-8.0  5.0  Glucose Latest Range: NEGATIVE mg/dL >4098 (A)  Bilirubin Urine Latest Range: NEGATIVE  NEGATIVE  Ketones, ur Latest Range: NEGATIVE mg/dL NEGATIVE  Protein Latest Range: NEGATIVE mg/dL >119 (A)  Urobilinogen, UA Latest Range: 0.0-1.0 mg/dL 0.2  Nitrite Latest Range: NEGATIVE  NEGATIVE  Leukocytes, UA Latest Range: NEGATIVE  NEGATIVE  Hgb urine dipstick Latest Range: NEGATIVE  MODERATE (A)  WBC, UA Latest Range: <3 WBC/hpf 0-2  RBC / HPF Latest Range: <3 RBC/hpf 3-6  Squamous Epithelial / LPF Latest Range: RARE  RARE  Bacteria, UA Latest Range: RARE  FEW (A)  Casts Latest Range: NEGATIVE  HYALINE CASTS (A)    Radiological Exams on Admission: Dg Chest Portable 1 View  01/16/2012  *RADIOLOGY REPORT*  Clinical Data: Chest pain, asthma  PORTABLE CHEST - 1 VIEW  Comparison: 09/08/2011  Findings: Increased interstitial markings, right lung predominant, possibly reflecting asymmetric edema or infection. No pleural effusion or pneumothorax.  Mild  cardiomegaly.  IMPRESSION: Increased interstitial markings, possibly reflecting asymmetric edema or infection.   Original Report Authenticated By: Charline Bills, M.D.     EKG: Normal sinus rhythm  Assessment/Plan Present on Admission:  .Chest pain Admit for 23 hour observation Cycle cardiac enzymes Lipid panel in a.m., aspirin in the a.m., nitroglycerin when necessary Likely mostly costochondritis. toradol ordered  ? Pneumonia Tobacco abuse COPD Will start antibiotics  Nicotine patch and duonebs ordered  .Hypertension Diabetes Stable resume home medication ADA diet and sliding scale insulin Social Suspect homelessness   Full code DVT prophylaxis   Arrion Broaddus 01/16/2012, 4:33 AM

## 2012-01-16 NOTE — Progress Notes (Signed)
56/F w/ h/o DM, HTN, homelessness admitted this am with chest wall pain by Dr.Corssley On exam significant tenderness of the anterior chest wall especially both costochondral margins Cardiac enzymes negative so far EKG unchanged from prior D dimer negative Assessment: musculoskeletal chest pain/chest wall pain Likely costochondritis Add ibuprofen with meals protonix Transfer to tele CSW consult for homelessness  Zannie Cove, MD [phone]

## 2012-01-16 NOTE — ED Notes (Signed)
IV team paged for IV start after unsuccessful attempt times one

## 2012-01-16 NOTE — ED Notes (Signed)
Pt consistently reports  8/10 CP. Cardiac monitor SR rate 66.

## 2012-01-16 NOTE — ED Notes (Signed)
Pt c/o onset  of left chest pain while on [employer] .  Does not know time of day  Pain began.  Describes Left CP as constant with radiation to right chest, associated with dizziness, upper back pain, SOB, diaphoresis.  Rates CP 10/10 , has not taken anything for pain.  Had similar episode one month ago.  FIrst onset of CP was 2 years ago.  Has no cardiologist

## 2012-01-17 DIAGNOSIS — R0789 Other chest pain: Secondary | ICD-10-CM

## 2012-01-17 DIAGNOSIS — E119 Type 2 diabetes mellitus without complications: Secondary | ICD-10-CM

## 2012-01-17 DIAGNOSIS — Z590 Homelessness: Secondary | ICD-10-CM

## 2012-01-17 LAB — GLUCOSE, CAPILLARY
Glucose-Capillary: 183 mg/dL — ABNORMAL HIGH (ref 70–99)
Glucose-Capillary: 162 mg/dL — ABNORMAL HIGH (ref 70–99)
Glucose-Capillary: 63 mg/dL — ABNORMAL LOW (ref 70–99)

## 2012-01-17 MED ORDER — INSULIN ASPART PROT & ASPART (70-30 MIX) 100 UNIT/ML ~~LOC~~ SUSP
10.0000 [IU] | Freq: Two times a day (BID) | SUBCUTANEOUS | Status: DC
  Administered 2012-01-17 – 2012-01-18 (×2): 10 [IU] via SUBCUTANEOUS
  Filled 2012-01-17: qty 3

## 2012-01-17 MED ORDER — TRAMADOL HCL 50 MG PO TABS
50.0000 mg | ORAL_TABLET | Freq: Four times a day (QID) | ORAL | Status: DC | PRN
  Administered 2012-01-17: 50 mg via ORAL
  Filled 2012-01-17: qty 1

## 2012-01-17 MED ORDER — AMLODIPINE BESYLATE 10 MG PO TABS
10.0000 mg | ORAL_TABLET | Freq: Every day | ORAL | Status: DC
  Administered 2012-01-17 – 2012-01-18 (×2): 10 mg via ORAL
  Filled 2012-01-17 (×2): qty 1

## 2012-01-17 NOTE — Progress Notes (Signed)
Utilization Review Completed.  

## 2012-01-17 NOTE — Progress Notes (Signed)
[facility]             Progress Note   Subjective: Still complains of severe chest pain, reports that she's eating less  Objective: Vital signs in last 24 hours: Temp:  [97.6 F (36.4 C)-98.5 F (36.9 C)] 97.6 F (36.4 C) (10/13 0500) Pulse Rate:  [63-75] 63  (10/13 0500) Resp:  [16-23] 18  (10/13 0500) BP: (156-182)/(65-93) 166/81 mmHg (10/13 0500) SpO2:  [97 %-99 %] 97 % (10/13 0500) Weight change:  Last BM Date: 01/16/12  Intake/Output from previous day: 10/12 0701 - 10/13 0700 In: 320 [P.O.:320] Out: 200 [Urine:200] Total I/O In: 130 [P.O.:130] Out: -    Physical Exam: General: Alert, awake, oriented x3, in no acute distress. HEENT: No bruits, no goiter.,  Chest wall tender at costochondral margins Heart: Regular rate and rhythm, without murmurs, rubs, gallops. Lungs: Clear to auscultation bilaterally. Abdomen: Soft, nontender, nondistended, positive bowel sounds. Extremities: No clubbing cyanosis or edema with positive pedal pulses. Neuro: Grossly intact, nonfocal.    Lab Results: Basic Metabolic Panel:  Basename 01/16/12 0438 01/16/12 0019  NA 141 138  K 3.4* 3.6  CL 106 102  CO2 24 24  GLUCOSE 206* 337*  BUN 16 18  CREATININE 0.82 0.90  CALCIUM 9.1 9.4  MG -- --  PHOS -- --   Liver Function Tests:  Basename 01/16/12 0019  AST 19  ALT 22  ALKPHOS 101  BILITOT 0.2*  PROT 7.3  ALBUMIN 3.5   No results found for this basename: LIPASE:2,AMYLASE:2 in the last 72 hours No results found for this basename: AMMONIA:2 in the last 72 hours CBC:  Basename 01/16/12 0437 01/16/12 0019  WBC 6.4 6.5  NEUTROABS -- 3.7  HGB 11.5* 12.4  HCT 34.7* 37.6  MCV 82.2 83.6  PLT 234 283   Cardiac Enzymes:  Basename 01/16/12 1604 01/16/12 0900 01/16/12 0437  CKTOTAL -- -- --  CKMB -- -- --  CKMBINDEX -- -- --  TROPONINI <0.30 <0.30 <0.30   BNP:  Basename 01/16/12 0900  PROBNP 175.1*   D-Dimer:  Basename 01/16/12 0216  DDIMER 0.32    CBG:  Basename 01/17/12 0751 01/16/12 2040 01/16/12 1703 01/16/12 1300 01/16/12 0809  GLUCAP 214* 145* 182* 249* 209*   Hemoglobin A1C: No results found for this basename: HGBA1C in the last 72 hours Fasting Lipid Panel: No results found for this basename: CHOL,HDL,LDLCALC,TRIG,CHOLHDL,LDLDIRECT in the last 72 hours Thyroid Function Tests: No results found for this basename: TSH,T4TOTAL,FREET4,T3FREE,THYROIDAB in the last 72 hours Anemia Panel: No results found for this basename: VITAMINB12,FOLATE,FERRITIN,TIBC,IRON,RETICCTPCT in the last 72 hours Coagulation: No results found for this basename: LABPROT:2,INR:2 in the last 72 hours Urine Drug Screen: Drugs of Abuse  No results found for this basename: labopia, cocainscrnur, labbenz, amphetmu, thcu, labbarb    Alcohol Level: No results found for this basename: ETH:2 in the last 72 hours Urinalysis:  Basename 01/16/12 0003  COLORURINE YELLOW  LABSPEC 1.034*  PHURINE 5.0  GLUCOSEU >1000*  HGBUR MODERATE*  BILIRUBINUR NEGATIVE  KETONESUR NEGATIVE  PROTEINUR >300*  UROBILINOGEN 0.2  NITRITE NEGATIVE  LEUKOCYTESUR NEGATIVE    Recent Results (from the past 240 hour(s))  MRSA PCR SCREENING     Status: Normal   Collection Time   01/16/12  6:28 AM      Component Value Range Status Comment   MRSA by PCR NEGATIVE  NEGATIVE Final     Studies/Results: Dg Chest Portable 1 View  01/16/2012  *RADIOLOGY REPORT*  Clinical  Data: Chest pain, asthma  PORTABLE CHEST - 1 VIEW  Comparison: 09/08/2011  Findings: Increased interstitial markings, right lung predominant, possibly reflecting asymmetric edema or infection. No pleural effusion or pneumothorax.  Mild cardiomegaly.  IMPRESSION: Increased interstitial markings, possibly reflecting asymmetric edema or infection.   Original Report Authenticated By: Charline Bills, M.D.     Medications: Scheduled Meds:   . enoxaparin (LOVENOX) injection  40 mg Subcutaneous Q24H  . ibuprofen   600 mg Oral TID WC  . insulin aspart  0-15 Units Subcutaneous TID WC  . insulin aspart  0-5 Units Subcutaneous QHS  . ketorolac      . moxifloxacin  400 mg Intravenous Q24H  . nicotine  14 mg Transdermal Daily  . nitroGLYCERIN  1 inch Topical Q6H  . pantoprazole  40 mg Oral BID AC  . sodium chloride  3 mL Intravenous Q12H   Continuous Infusions:  PRN Meds:.acetaminophen, acetaminophen, albuterol, alum & mag hydroxide-simeth, cloNIDine, ipratropium, ondansetron (ZOFRAN) IV, ondansetron, senna-docusate, sodium chloride, traMADol, zolpidem  Assessment/Plan:  1. Chest wall pain/Musculoskeletal Continue Ibuprofen with PPI Add tramadol, avoid narcotics Cardiac enzymes, D-dimer negative  2. DM: resume insulin 70/30, metformin at DC  3. HTN: start Amlodipine  4. Homelessness: CSW consult still pending   HOME TOMORROW    Time spent coordinating care:   LOS: 1 day   [facility] Pager: [phone] 01/17/2012, 9:28 AM

## 2012-01-18 DIAGNOSIS — I10 Essential (primary) hypertension: Secondary | ICD-10-CM

## 2012-01-18 LAB — GLUCOSE, CAPILLARY: Glucose-Capillary: 114 mg/dL — ABNORMAL HIGH (ref 70–99)

## 2012-01-18 MED ORDER — HYDROCHLOROTHIAZIDE 25 MG PO TABS: 25.0000 mg | ORAL_TABLET | Freq: Every day | ORAL | Status: DC

## 2012-01-18 MED ORDER — TRAMADOL HCL 50 MG PO TABS: 50.0000 mg | ORAL_TABLET | Freq: Four times a day (QID) | ORAL | Status: DC | PRN

## 2012-01-18 MED ORDER — HYDROCHLOROTHIAZIDE 25 MG PO TABS
25.0000 mg | ORAL_TABLET | Freq: Every day | ORAL | Status: DC
  Administered 2012-01-18: 25 mg via ORAL
  Filled 2012-01-18: qty 1

## 2012-01-18 MED ORDER — IBUPROFEN 600 MG PO TABS: 600.0000 mg | ORAL_TABLET | Freq: Three times a day (TID) | ORAL | Status: DC

## 2012-01-18 MED ORDER — OMEPRAZOLE 40 MG PO CPDR: 40.0000 mg | DELAYED_RELEASE_CAPSULE | Freq: Every day | ORAL | Status: DC

## 2012-01-18 MED ORDER — AMLODIPINE BESYLATE 10 MG PO TABS: 10.0000 mg | ORAL_TABLET | Freq: Every day | ORAL | Status: DC

## 2012-01-18 NOTE — Care Management Note (Signed)
    Page 1 of 1   01/18/2012     11:00:19 AM   CARE MANAGEMENT NOTE 01/18/2012  Patient:  Vanessa Hull, Vanessa Hull   Account Number:  [id number]  Date Initiated:  01/18/2012  Documentation initiated by:  GRAVES-BIGELOW,Dudley Cooley  Subjective/Objective Assessment:   Pt admitted with cp. Plan for home today.     Action/Plan:   CM provided pt with the [facility] Number and list of PCP's accepting new pt's. No furhter needs from CM at this time.   Anticipated DC Date:  01/18/2012   Anticipated DC Plan:  HOME/SELF CARE      DC Planning Services  CM consult      Choice offered to / List presented to:             Status of service:  Completed, signed off Medicare Important Message given?   (If response is "NO", the following Medicare IM given date fields will be blank) Date Medicare IM given:   Date Additional Medicare IM given:    Discharge Disposition:  HOME/SELF CARE  Per UR Regulation:  Reviewed for med. necessity/level of care/duration of stay  If discussed at Long Length of Stay Meetings, dates discussed:    Comments:

## 2012-01-18 NOTE — Clinical Social Work Psychosocial (Addendum)
    Clinical Social Work Department BRIEF PSYCHOSOCIAL ASSESSMENT 01/18/2012  Patient:  Vanessa Hull, Vanessa Hull     Account Number:  [id number]     Admit date:  01/15/2012  Clinical Social Worker:  Margaree Mackintosh  Date/Time:  01/18/2012 12:31 PM  Referred by:  Physician  Date Referred:  01/18/2012 Referred for  Homelessness   Other Referral:   Interview type:  Patient Other interview type:    PSYCHOSOCIAL DATA Living Status:  ALONE Admitted from facility:   Level of care:   Primary support name:  Unknown. Primary support relationship to patient:   Degree of support available:    CURRENT CONCERNS Current Concerns  Post-Acute Placement   Other Concerns:    SOCIAL WORK ASSESSMENT / PLAN Clinical Social Worker recieved referral indicating pt is homeless and is requesting assistance.  CSW reviewed chart and met with pt at bedside.  CSW introduced self, explained role, and provided support.  Pt stated, "I need you to help me get into a hotel for a week".  CSW asked for clarification.  To which pt explained that she recieves 1,000$/month in disability and currently recieves [employer].  Pt states she "only has 20$ to her name".  CSW inquired about support systems.  Pt stated she has an aunt in [place] with whom she could stay "If you (this CSW) got me down there".  CSW offered to phone the aunt to help coordinate living arrangements and transportation.  Pt states she doesn't plan on staying with her aunt and would like to stay at the [facility] in [place].  CSW reviewed ticket prices to get to [place]; the cost would be 21.50$.  CSW reviewed this informaiton with pt who stated she needs her 20$ to purchase presciptions but she is unsure how much her prescriptions will cost.  CSW provided a list of low cost apartments in both [place] and [place] and encouraged pt to begin calling facilities while in the hospital and has access to a phone. Pt is agreeable to staying in  a shelter for two weeks until her disability check becomes available.  CSW to staff case with [employer].  CSW to continue to follow and assist as needed.   Assessment/plan status:  Information/Referral to [employer] Other assessment/ plan:   Information/referral to community resources:   Shelters  Low income housing-[place]  Low income housing-[place]. PCP's who accept Medicare in [place].    PATIENT'S/FAMILY'S RESPONSE TO PLAN OF CARE: Pt was engaged in conversation.

## 2012-01-18 NOTE — Progress Notes (Signed)
Clinical Social Worker spoke by phone with pt's nephew and aunt.  CSW inquired about pt residing with them.  Both stated that pt was not welcome to stay with them.  CSW reviewed options with pt.  CSW provided pt with a bus pass.  Pt to dc to shelter.  CSW encouraged pt to review list of low income apartments to move to once her benefits check is deposited on the 3rd.  CSW to sign off.  Please re consult if needed.   Angelia Mould, MSW, [place] [phone]

## 2012-01-18 NOTE — Progress Notes (Signed)
Hypoglycemic Event  CBG: 63  Treatment: 15 GM carbohydrate snack  Symptoms: Shaky  Follow-up CBG: Time:2130 CBG Result: 118  Possible Reasons for Event:   Comments/MD notified:    Vanessa Hull, Vanessa Hull  Remember to initiate Hypoglycemia Order Set & complete

## 2012-01-18 NOTE — Discharge Summary (Signed)
Physician Discharge Summary  Patient ID: Vanessa Hull MRN: [id number] DOB/AGE: [birth year] 56 y.o.  Admit date: 01/16/2012 Discharge date: 01/18/2012  Primary Care Physician:  Vanessa Oats, MD   Discharge Diagnoses:   Chest wall pain/Costochondritis  Hypertension  Diabetes mellitus Homelessness Tobacco use      Medication List     As of 01/18/2012 10:41 AM    TAKE these medications         albuterol 108 (90 BASE) MCG/ACT inhaler   Commonly known as: PROVENTIL HFA;VENTOLIN HFA   Inhale 2 puffs into the lungs every 6 (six) hours as needed for wheezing or shortness of breath.      amLODipine 10 MG tablet   Commonly known as: NORVASC   Take 1 tablet (10 mg total) by mouth daily.      aspirin EC 81 MG tablet   Take 81 mg by mouth daily.      hydrochlorothiazide 25 MG tablet   Commonly known as: HYDRODIURIL   Take 1 tablet (25 mg total) by mouth daily.      ibuprofen 600 MG tablet   Commonly known as: ADVIL,MOTRIN   Take 1 tablet (600 mg total) by mouth 3 (three) times daily with meals. For 2-3 days      insulin NPH-insulin regular (70-30) 100 UNIT/ML injection   Commonly known as: NOVOLIN 70/30   Inject 10-15 Units into the skin See admin instructions. Use 15 units in AM and 10 units in PM      omeprazole 40 MG capsule   Commonly known as: PRILOSEC   Take 1 capsule (40 mg total) by mouth daily.      traMADol 50 MG tablet   Commonly known as: ULTRAM   Take 1 tablet (50 mg total) by mouth every 6 (six) hours as needed.         Disposition and Follow-up:  New PCP in 1 week   Significant Diagnostic Studies:  No results found.  Brief H and P: This is a 56 year old female very vague historian, who says yes to almost every review of system who states today she developed severe chest pains or radiate to her back it made her dizzy. She states that the pain radiates to the left and right of her chest, she states she is wheezing, she has chest pain with deep  breathing, a mildly productive cough with yellow phlegm. She reports she has fever, chills, nausea, vomiting, palpitations, diaphoresis, orthopnea and lower extremity edema. Her symptoms are similar to when she had an MI before, she reports having a stent placed before, however unable to tell us where to confirm this information. Her pain ranked 10 out of 10, currently 9/10. She describes the pain as a mix between sharp and dull and continuous since yesterday. History provided by patient who appears comfortable in the ER.    Hospital Course:  1. Chest wall pain/Musculoskeletal secondary to costochondritis, with tenderness to palpation Ruled out for ACS based on unchanged EKG and normal cardiac enzymes despite persistent chest pain Continue Ibuprofen with PPI  Add tramadol, avoid narcotics  D-dimer negative  Advised to Fu with PCP To be seen by case manager prior to DC She gives a vague history of MI /stent in [place], but unable to give [place] name of hospital or other information that could be verified Recurrent presentations to Ed with similar complaints  2. DM: resume insulin 70/30  3. HTN: start Amlodipine and HCTZ  4. Homelessness: CSW consult pending, will be discharged  after CSW eval   Time spent on Discharge:  Signed: Yesli Vanderhoff [facility]  01/18/2012, 10:41 AM

## 2012-03-24 IMAGING — CR DG CHEST 2V
2 series · 2 of 2 positions shown · non-contrast
Comparison: 06/28/2009.

CLINICAL DATA: Chest pain.  Shortness of breath.

CHEST - 2 VIEW

[w chest pa]
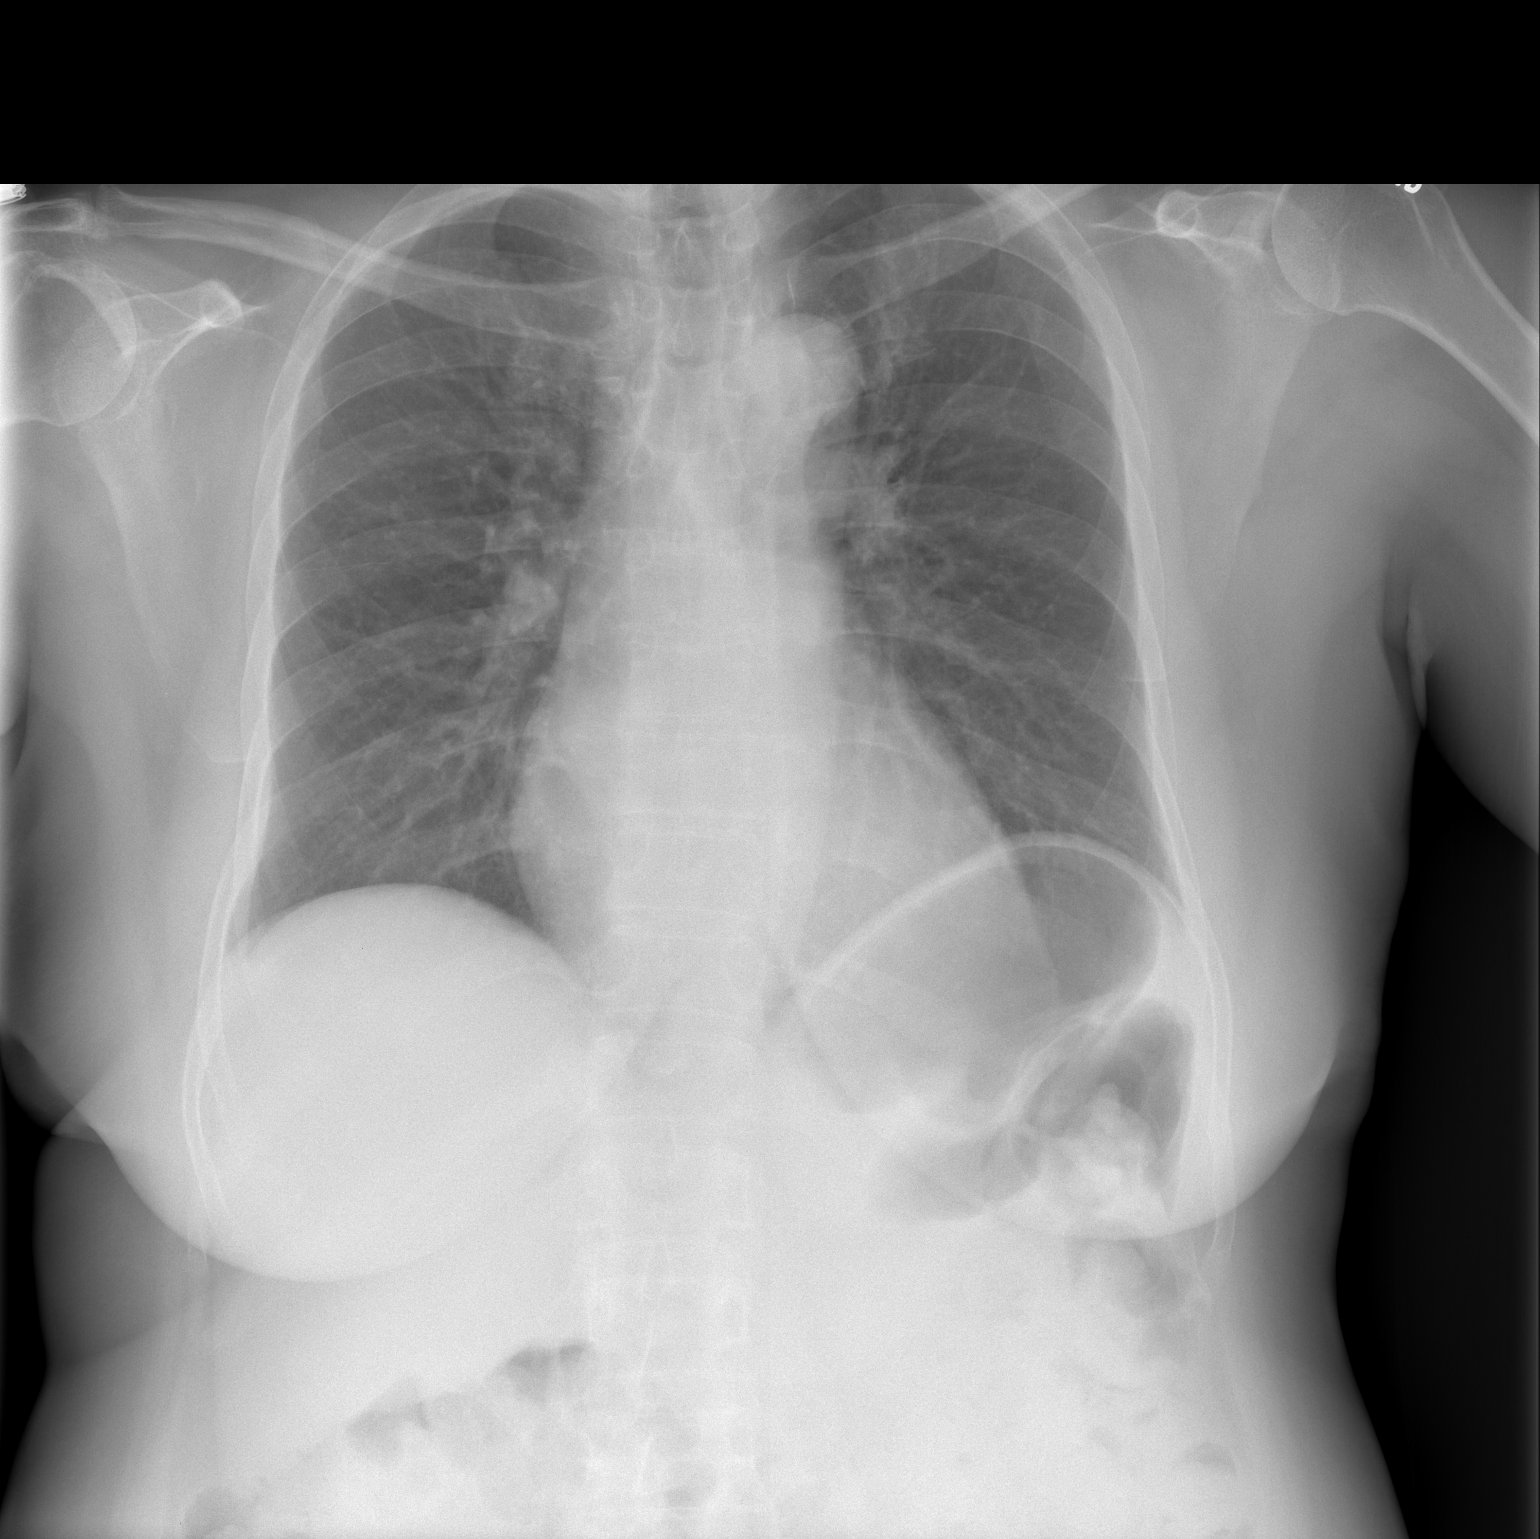

[w chest lat]
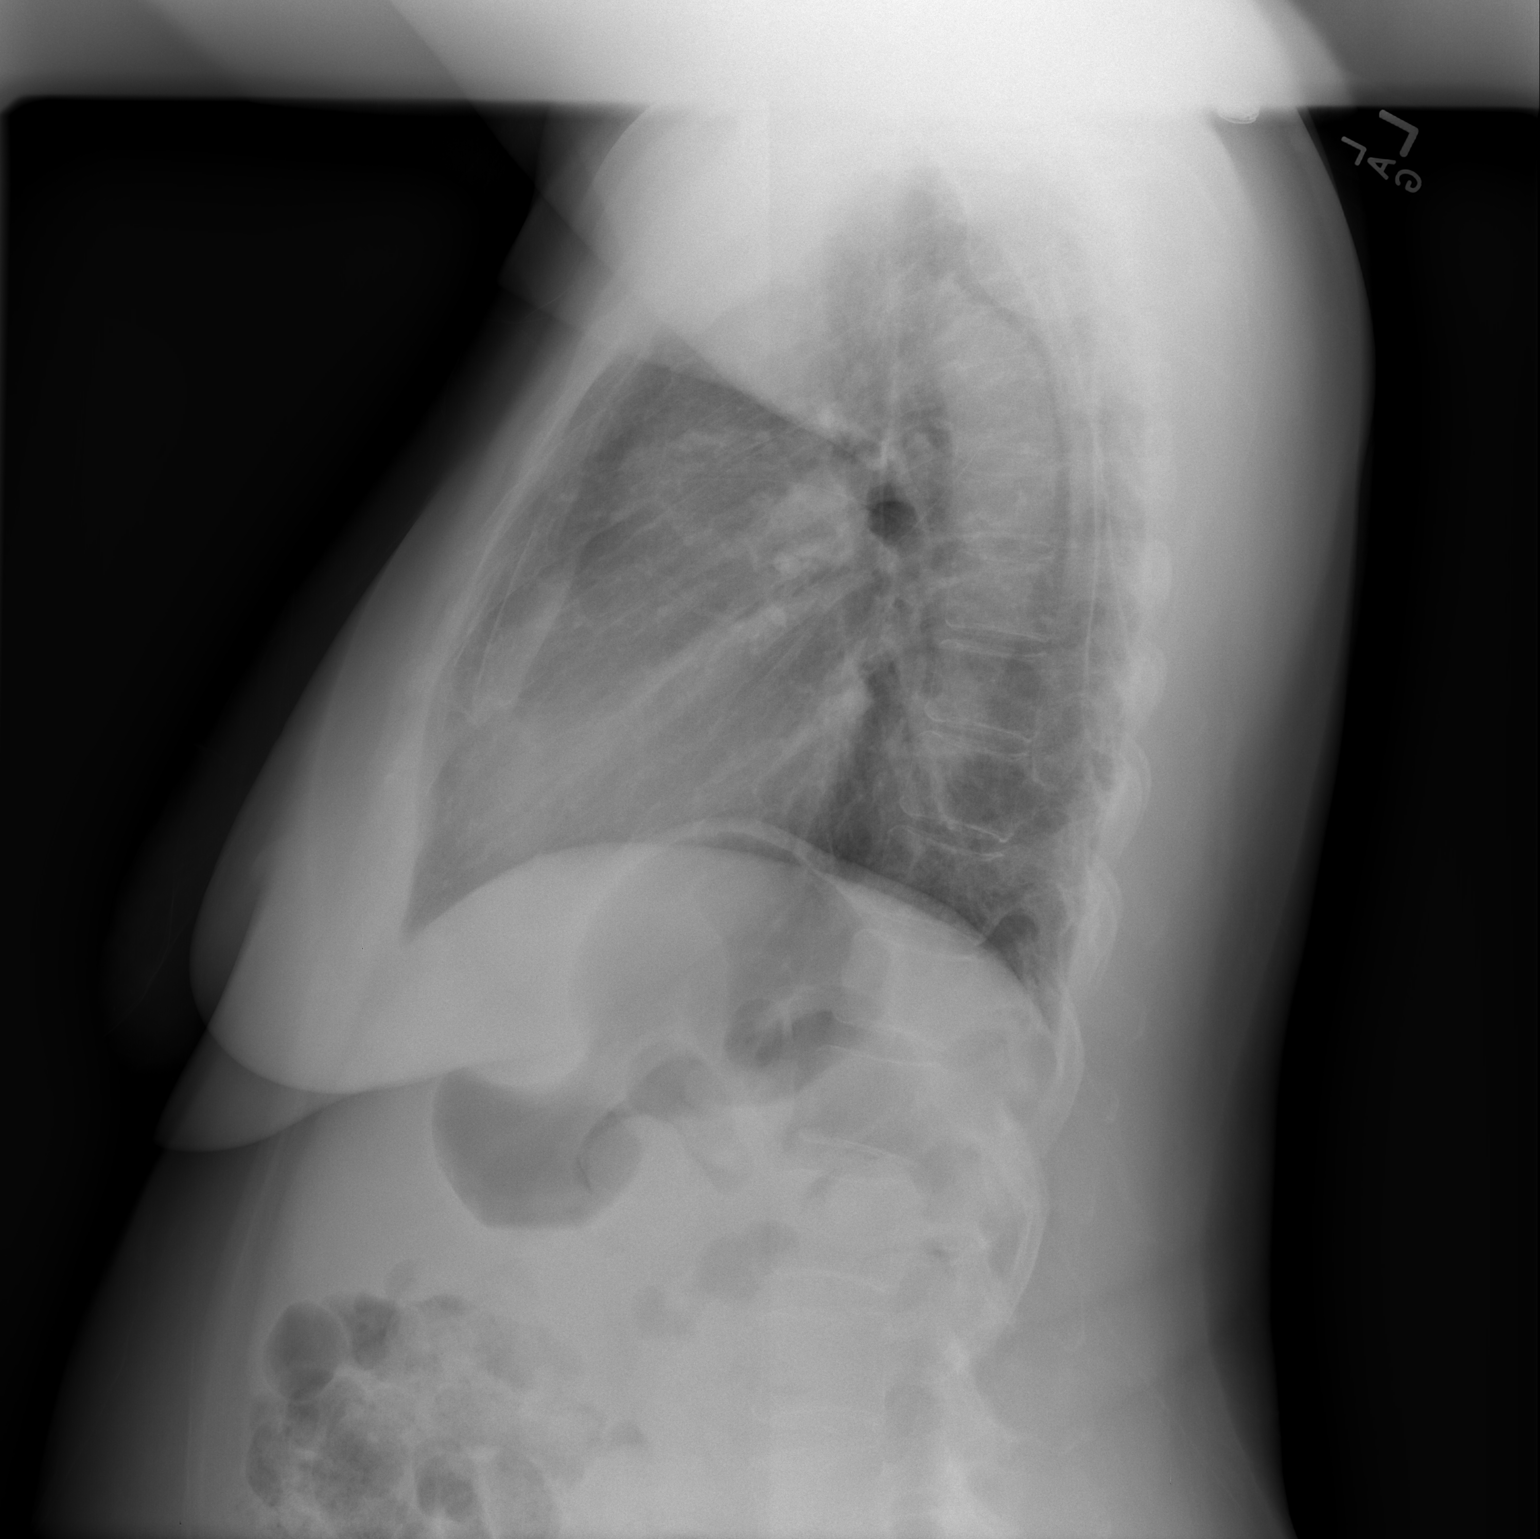

[2 of 2 positions shown; findings below may reference images not displayed]

FINDINGS: The heart size is normal.  The lungs are clear.  The
visualized soft tissues and bony thorax are unremarkable.
IMPRESSION: Negative chest.

## 2013-04-12 ENCOUNTER — Emergency Department (HOSPITAL_COMMUNITY): Payer: Medicare Other

## 2013-04-12 ENCOUNTER — Emergency Department (HOSPITAL_COMMUNITY)
Admission: EM | Admit: 2013-04-12 | Discharge: 2013-04-12 | Disposition: A | Discharge status: Home / Self Care | Payer: Medicare Other | Attending: Emergency Medicine | Admitting: Emergency Medicine

## 2013-04-12 ENCOUNTER — Encounter (HOSPITAL_COMMUNITY): Payer: Self-pay | Admitting: Emergency Medicine

## 2013-04-12 DIAGNOSIS — J45909 Unspecified asthma, uncomplicated: Secondary | ICD-10-CM | POA: Insufficient documentation

## 2013-04-12 DIAGNOSIS — E119 Type 2 diabetes mellitus without complications: Secondary | ICD-10-CM | POA: Insufficient documentation

## 2013-04-12 DIAGNOSIS — F17200 Nicotine dependence, unspecified, uncomplicated: Secondary | ICD-10-CM | POA: Insufficient documentation

## 2013-04-12 DIAGNOSIS — Z9861 Coronary angioplasty status: Secondary | ICD-10-CM | POA: Insufficient documentation

## 2013-04-12 DIAGNOSIS — I10 Essential (primary) hypertension: Secondary | ICD-10-CM | POA: Insufficient documentation

## 2013-04-12 DIAGNOSIS — R079 Chest pain, unspecified: Secondary | ICD-10-CM | POA: Insufficient documentation

## 2013-04-12 LAB — CBC
HCT: 41 % (ref 36.0–46.0)
Hemoglobin: 14 g/dL (ref 12.0–15.0)
MCH: 28.7 pg (ref 26.0–34.0)
MCHC: 34.1 g/dL (ref 30.0–36.0)
MCV: 84 fL (ref 78.0–100.0)
PLATELETS: 278 10*3/uL (ref 150–400)
RBC: 4.88 MIL/uL (ref 3.87–5.11)
RDW: 13.1 % (ref 11.5–15.5)
WBC: 5.7 10*3/uL (ref 4.0–10.5)

## 2013-04-12 LAB — POCT I-STAT TROPONIN I
TROPONIN I, POC: 0.01 ng/mL (ref 0.00–0.08)
Troponin i, poc: 0.01 ng/mL (ref 0.00–0.08)

## 2013-04-12 LAB — BASIC METABOLIC PANEL
BUN: 11 mg/dL (ref 6–23)
CALCIUM: 9.1 mg/dL (ref 8.4–10.5)
CO2: 29 mEq/L (ref 19–32)
CREATININE: 0.9 mg/dL (ref 0.50–1.10)
Chloride: 103 mEq/L (ref 96–112)
GFR calc Af Amer: 80 mL/min — ABNORMAL LOW (ref >90)
GFR, EST NON AFRICAN AMERICAN: 69 mL/min — AB (ref >90)
GLUCOSE: 225 mg/dL — AB (ref 70–99)
Potassium: 4.1 mEq/L (ref 3.7–5.3)
SODIUM: 144 meq/L (ref 137–147)

## 2013-04-12 MED ORDER — MORPHINE SULFATE 4 MG/ML IJ SOLN
4.0000 mg | Freq: Once | INTRAMUSCULAR | Status: AC
  Administered 2013-04-12: 4 mg via INTRAVENOUS
  Filled 2013-04-12: qty 1

## 2013-04-12 MED ORDER — IBUPROFEN 800 MG PO TABS: 800.0000 mg | ORAL_TABLET | Freq: Three times a day (TID) | ORAL | Status: AC | PRN

## 2013-04-12 MED ORDER — METOPROLOL TARTRATE 25 MG PO TABS
25.0000 mg | ORAL_TABLET | Freq: Once | ORAL | Status: AC
Start: 2013-04-12 — End: 2013-04-12
  Administered 2013-04-12: 25 mg via ORAL
  Filled 2013-04-12: qty 1

## 2013-04-12 MED ORDER — HYDROCODONE-ACETAMINOPHEN 5-325 MG PO TABS: 1.0000 | ORAL_TABLET | Freq: Four times a day (QID) | ORAL | Status: AC | PRN

## 2013-04-12 MED ORDER — KETOROLAC TROMETHAMINE 30 MG/ML IJ SOLN
30.0000 mg | Freq: Once | INTRAMUSCULAR | Status: AC
Start: 2013-04-12 — End: 2013-04-12
  Administered 2013-04-12: 30 mg via INTRAVENOUS
  Filled 2013-04-12: qty 1

## 2013-04-12 NOTE — Progress Notes (Signed)
CSW provided pt with taxi voucher transportation.   

## 2013-04-12 NOTE — ED Notes (Signed)
Per EMS, pat c/ of centralized chest pain radiating to left arm, waited 3 hours to call EMS after pain did not dissapate.  [facility] [employer].  3 nitro SL given PTA with complete relief but pain has returned.  324 ASA taken PTA  EMS VS: BP: 240/124, 169/101 (after 3 NTG), 99% RA, CBG 189 Hx of DM, anxiety, HTN  Currently chest pain 10/10 per patient

## 2013-04-12 NOTE — ED Notes (Signed)
Social work and case worker at bedside to speak with pt. Pt now c/o chest pain states "I can't walk and I have chest pain again do you think you can give me something?" Dr. Criss AlvineGoldston made aware, states to perform repeat EKG and ambulate patient

## 2013-04-12 NOTE — Discharge Instructions (Signed)
Followup with [facility] cardiology on Friday at 12 PM.  Return here as needed.

## 2013-04-12 NOTE — ED Notes (Signed)
Pt ambulatory in hallway to restroom without issue

## 2013-04-12 NOTE — ED Notes (Signed)
Patient bck from xray

## 2013-04-12 NOTE — ED Notes (Signed)
Case management at bedside, waiting for social worker.

## 2013-04-12 NOTE — ED Notes (Signed)
Pt given 4 bus passes and Cab voucher by social work/Case Management. Pt given 2 prescriptions and states is comfortable going to MD appointment tomorrow. [employer] called for pt, pt wheeled to lobby to await cab arrival. Pt calm, interactive, in NAD. Dr. Criss AlvineGoldston states pt OK for discharge.

## 2013-04-12 NOTE — Progress Notes (Signed)
ED CM received referral to set up PCP at [facility]. Pt presented to [facility] ED with CP with hx of Asthma Hypertension, and Diabetes mellitus. In room to speak with patient. She states, that she is homeless and living in the [facility]. She reports receiving SSD and has medicare. However, this past month she have not been able to get her funds from her ATM. She states, she has been trying to call the [facility] but have not gotten to speak with anyone. ED CSW also consulted. Pt also reports not being able to get her medications because of not having the funds. Pt unable to tell me what meds she is currently on. She states, that [facility]. Contacted [facility] to confirm meds, Pharmacist reports record of her last pick up date was 01/2012. Spoke with patient she states that may be right. Discussed the importance of having a PCP, and taking medications regularly. Discussed  setting up a f/u appt with [place], and medication assistance pt agrees with plan. Pt provided permission to email information to schedulers at [facility] for a f/u appt ASAP. Explained that someone will notify patient by phone, at the number she provided in regards to a f/u appt.   Pt also  given resource for [facility], and encouraged contact them M-F until 3p. Pt verbalized understanding used the teach back method. Pt agrees with disposition plan. Plan also discussed with Emilly RN on Pdd C. Pt does not express any further questions or concerns. No further ED CM needs identified.

## 2013-04-12 NOTE — ED Notes (Signed)
Pt awaiting social work consult. 

## 2013-04-12 NOTE — ED Notes (Addendum)
Error in ECG time charting. ECG marked collected at 1454 was actually collected at 1908. Dr. Criss AlvineGoldston aware, states pt is OK for discharge. Pt awaiting cab voucher from social work.

## 2013-04-12 NOTE — ED Notes (Signed)
Patient appears anxious, she requests pain medications and something for anxiety

## 2013-04-12 NOTE — ED Provider Notes (Signed)
CSN: [id number]     Arrival date & time 04/12/13  0753 History   First MD Initiated Contact with Patient 04/12/13 0840     Chief Complaint  Patient presents with  . Chest Pain   (Consider location/radiation/quality/duration/timing/severity/associated sxs/prior Treatment) HPI Patient presents to the emergency department with constant, chest pain since 4 AM the patient, states, chest pain, has been constant, and radiates in the left side of her chest.  Right time.  The patient, states, that nothing seems make her condition, better or worse.  Patient denies shortness breath, nausea, vomiting, diaphoresis, headache, blurred vision, weakness, numbness, dizziness, fever, cough, rash, or syncope.  The patient, states she did not take any medications prior to arrival.  Patient, states she did walk to the fire department to seek care. Past Medical History  Diagnosis Date  . Asthma   . Hypertension   . Diabetes mellitus    Past Surgical History  Procedure Laterality Date  . Abdominal hysterectomy    . Breast cyst excision    . Coronary angioplasty with stent placement      Entirely unclear. States [place] was [place] and cannot give any accurate details of this   Family History  Problem Relation Age of Onset  . Leukemia Father    History  Substance Use Topics  . Smoking status: Current Some Day Smoker -- 5.00 packs/day for 5 years    Types: Cigarettes  . Smokeless tobacco: Not on file  . Alcohol Use: Yes     Comment: beer sometimes   OB History   Grav Para Term Preterm Abortions TAB SAB Ect Mult Living                 Review of Systems All other systems negative except as documented in the HPI. All pertinent positives and negatives as reviewed in the HPI. Allergies  Codeine  Home Medications  No current outpatient prescriptions on file. BP 190/89  Pulse 89  Temp(Src) 98.5 F (36.9 C) (Oral)  Resp 18  SpO2 100% Physical Exam  Nursing note and vitals  reviewed. Constitutional: She is oriented to person, place, and time. She appears well-developed and well-nourished. No distress.  HENT:  Head: Normocephalic and atraumatic.  Mouth/Throat: Oropharynx is clear and moist.  Eyes: EOM are normal. Pupils are equal, round, and reactive to light.  Neck: Normal range of motion. Neck supple.  Cardiovascular: Normal rate and regular rhythm.  Exam reveals no gallop and no friction rub.   No murmur heard. Pulmonary/Chest: Effort normal and breath sounds normal. No respiratory distress.  Neurological: She is alert and oriented to person, place, and time. She exhibits normal muscle tone. Coordination normal.  Skin: Skin is warm and dry. No rash noted. No erythema.    ED Course  Procedures (including critical care time) Labs Review Labs Reviewed  BASIC METABOLIC PANEL - Abnormal; Notable for the following:    Glucose, Bld 225 (*)    GFR calc non Af Amer 69 (*)    GFR calc Af Amer 80 (*)    All other components within normal limits  CBC  POCT I-STAT TROPONIN I   Imaging Review Dg Chest 2 View  04/12/2013   CLINICAL DATA:  Worsening chest pain with movement.  EXAM: CHEST  2 VIEW  COMPARISON:  01/16/2012 chest radiograph. Lumbar spine radiographs 09/22/2011.  FINDINGS: Tortuous thoracic aorta. Cardiopericardial silhouette within normal limits. There is no airspace disease. Negative for pleural effusion. Elevation of the left hemidiaphragm  with basilar atelectasis. This is a chronic finding. Thoracolumbar compression fracture of L1 is chronic.  IMPRESSION: No active cardiopulmonary disease.   Electronically Signed   By: Andreas Newport M.D.   On: 04/12/2013 10:09    EKG Interpretation    Date/Time:  Wednesday April 12 2013 08:03:52 EST Ventricular Rate:  75 PR Interval:  152 QRS Duration: 76 QT Interval:  439 QTC Calculation: 490 R Axis:   7 Text Interpretation:  Sinus rhythm Abnormal R-wave progression, early transition Left ventricular  hypertrophy Nonspecific T abnrm, anterolateral leads Borderline prolonged QT interval Baseline wander in lead(s) III V1 Confirmed by Rhunette Croft, MD, ANKIT ([id number]) on 04/12/2013 9:22:31 AM           Social work has been involved with the patient's care to help kind assistance for housing and medications.  I spoke with cardiology, who will follow her up in the next one to 2 days.  Patient's chest pain is atypical, based on the fact that has been constant, and is significantly reproducible on palpation.  The patient has a heart score of 3.  I feel it is safe for the patient.  Followup on an outpatient basis  Carlyle Dolly, [place] 04/12/13 1619

## 2013-04-12 NOTE — ED Notes (Signed)
Social worker at bedside.

## 2013-04-12 NOTE — ED Notes (Signed)
Spoke with Burna MortimerWanda, Case Manager RN, states she would like to speak with Dr. Criss AlvineGoldston prior to pt discharge. Will hold pt until cleared with Wyvonne LenzWanda RN.

## 2013-04-12 NOTE — ED Notes (Signed)
Burna MortimerWanda RN at bedside

## 2013-04-13 NOTE — ED Provider Notes (Signed)
Medical screening examination/treatment/procedure(s) were conducted as a shared visit with non-physician practitioner(s) and myself.  I personally evaluated the patient during the encounter.  EKG Interpretation    Date/Time:  Wednesday April 12 2013 08:03:52 EST Ventricular Rate:  75 PR Interval:  152 QRS Duration: 76 QT Interval:  439 QTC Calculation: 490 R Axis:   7 Text Interpretation:  Sinus rhythm Abnormal R-wave progression, early transition Left ventricular hypertrophy Nonspecific T abnrm, anterolateral leads Borderline prolonged QT interval Baseline wander in lead(s) III V1 Confirmed by Lynsie Mcwatters, MD, Virda Betters ([id number]) on 04/12/2013 9:22:31 AM           Pt comes in with cc of chest pain. Atypical mostly - but does state that sometimes the pain has radiated to the left side, and with arm numbness. HEART score is 3 (hx is low suspicion) - discussed case with Hospitalist -and they dont think patient needs admission given atypical story and serial trops that are negative - and so i reassessed patient, and her chest pain was reproducible as well.  Cards called for f/u - as she has DM, and uncontrolled BP. She will be discharged.     Derwood KaplanAnkit Gracyn Allor, MD 04/13/13 2130

## 2013-04-14 ENCOUNTER — Ambulatory Visit: Payer: BLUE CROSS/BLUE SHIELD | Admitting: Cardiology

## 2013-04-17 ENCOUNTER — Telehealth (HOSPITAL_COMMUNITY): Payer: Self-pay

## 2013-08-13 ENCOUNTER — Emergency Department (HOSPITAL_COMMUNITY)
Admission: EM | Admit: 2013-08-13 | Discharge: 2013-08-13 | Disposition: A | Discharge status: Home / Self Care | Payer: Medicare Other | Attending: Emergency Medicine | Admitting: Emergency Medicine

## 2013-08-13 ENCOUNTER — Encounter (HOSPITAL_COMMUNITY): Payer: Self-pay | Admitting: Emergency Medicine

## 2013-08-13 DIAGNOSIS — M545 Low back pain, unspecified: Secondary | ICD-10-CM | POA: Insufficient documentation

## 2013-08-13 DIAGNOSIS — H9209 Otalgia, unspecified ear: Secondary | ICD-10-CM | POA: Insufficient documentation

## 2013-08-13 DIAGNOSIS — E119 Type 2 diabetes mellitus without complications: Secondary | ICD-10-CM | POA: Insufficient documentation

## 2013-08-13 DIAGNOSIS — I10 Essential (primary) hypertension: Secondary | ICD-10-CM | POA: Insufficient documentation

## 2013-08-13 DIAGNOSIS — J45909 Unspecified asthma, uncomplicated: Secondary | ICD-10-CM | POA: Insufficient documentation

## 2013-08-13 DIAGNOSIS — K029 Dental caries, unspecified: Secondary | ICD-10-CM | POA: Insufficient documentation

## 2013-08-13 DIAGNOSIS — G8929 Other chronic pain: Secondary | ICD-10-CM | POA: Insufficient documentation

## 2013-08-13 DIAGNOSIS — Z9861 Coronary angioplasty status: Secondary | ICD-10-CM | POA: Insufficient documentation

## 2013-08-13 MED ORDER — IBUPROFEN 600 MG PO TABS: 600.0000 mg | ORAL_TABLET | Freq: Four times a day (QID) | ORAL | Status: AC | PRN

## 2013-08-13 MED ORDER — IBUPROFEN 200 MG PO TABS
600.0000 mg | ORAL_TABLET | Freq: Once | ORAL | Status: AC
  Administered 2013-08-13: 600 mg via ORAL
  Filled 2013-08-13: qty 3

## 2013-08-13 NOTE — Discharge Instructions (Signed)
°Emergency Department Resource Guide °1) Find a Doctor and Pay Out of Pocket °Although you won't have to find out who is covered by your insurance plan, it is a good idea to ask around and get recommendations. You will then need to call the office and see if the doctor you have chosen will accept you as a new patient and what types of options they offer for patients who are self-pay. Some doctors offer discounts or will set up payment plans for their patients who do not have insurance, but you will need to ask so you aren't surprised when you get to your appointment. ° °2) Contact Your Local Health Department °Not all health departments have doctors that can see patients for sick visits, but many do, so it is worth a call to see if yours does. If you don't know where your local health department is, you can check in your phone book. The CDC also has a tool to help you locate your state's health department, and many state websites also have listings of all of their local health departments. ° °3) Find a Walk-in Clinic °If your illness is not likely to be very severe or complicated, you may want to try a walk in clinic. These are popping up all over the country in pharmacies, drugstores, and shopping centers. They're usually staffed by nurse practitioners or physician assistants that have been trained to treat common illnesses and complaints. They're usually fairly quick and inexpensive. However, if you have serious medical issues or chronic medical problems, these are probably not your best option. ° °No Primary Care Doctor: °- Call [facility] at  832-8000 - they can help you locate a primary care doctor that  accepts your insurance, provides certain services, etc. °- Physician Referral Service- 1-800-533-3463 ° °Chronic Pain Problems: °Organization         Address  Phone   Notes  °[facility]  (336) 297-2271 Patients need to be referred by their primary care doctor.  ° °Medication  Assistance: °Organization         Address  Phone   Notes  °[place] [facility] [address] °[place], [place] [address] (336) 641-8030 --Must be a resident of [place] °-- Must have NO insurance coverage whatsoever (no Medicaid/ Medicare, etc.) °-- The pt. MUST have a primary care doctor that directs their care regularly and follows them in the community °  °[facility]  (866) 331-1348   °[employer]  (888) 892-1162   ° °Agencies that provide inexpensive medical care: °Organization         Address  Phone   Notes  °[facility]  (336) 832-8035   °[facility]    (336) 832-7272   °[facility] [address] °[place], [place] [address] (336) 832-4777   °[facility] [address], °[place] (336) 271-4999   °[facility]    (336) 373-0678   °[facility]    (336) 272-1050   °[facility] ° [address], [place] Phone:  (336) 832-4444, Fax:  (336) 832-4440 Hours of Operation:  9 am - 6 pm, M-F.  Also accepts Medicaid/Medicare and self-pay.  °[facility] ° [address], [place] Phone: (336) 832-3150, Fax: (336) 832-3151. Hours of Operation:  8:30 am - 5:30 pm, M-F.  Also accepts Medicaid and self-pay.  °[facility] [place] [address], [place] Phone: (336) 878-6027   °[facility] [address], [place], [place] (336)723-1848, Ext. 123 Mondays & Thursdays: 7-9 AM.  First 15 patients are seen on a first come, first serve basis. °  ° °Medicaid-accepting [place] Providers: ° °Organization         Address  Phone   Notes  °[facility] [address], [place] (336) 641-2100 Also accepts self-pay patients.  °[facility] [address], [place] ° (336) 856-9996   °[facility] [address], [place]  (336) 288-8857   °[facility] [address] [place] [address], [place] (336) 299-7000   °Veita Bland [address], [place]  ° (336) 373-1557 Only accepts [place] Access Medicaid patients after they have their name applied to their card.  ° °Self-Pay (no insurance) in [place]: ° °Organization         Address  Phone   Notes  °Sickle Cell Patients, [facility] [address], [place] (336) 832-1970   °[facility] [address], [place] (336) 832-4400   °[facility] ° [address], [place] (336) 992-4800   °[facility]/Dr. Osei-Bonsu ° [address] [place] [address], [place] or [address], [place] (336) 841-8500 Phone number for both [place] and [place] locations is the same.  °[facility] [address], [place] (336) 299-0000   °[facility] [address] [place] [address], [place] or [address] (336) 852-7530 °(336) 878-2260   °[facility] [address], [place] (336) 350-1642, phone; (336) 294-5005, fax Sees patients 1st and 3rd Saturday of every month.  Must not qualify for public or private insurance (i.e. Medicaid, Medicare, [place] Health Choice, Veterans' Benefits) • Household income should be no more than 200% of the poverty level •The clinic cannot treat you if you are pregnant or think you are pregnant • Sexually transmitted diseases are not treated at the clinic.  ° ° °Dental Care: °Organization         Address  Phone  Notes  °[place] [facility] [address], [place] (336) 641-6152 Accepts children up to age 21 who are enrolled in Medicaid or [place] Health Choice; pregnant women with a Medicaid card; and children who have applied for Medicaid or [place] Health Choice, but were declined, whose parents can pay a reduced fee at time of service.  °[place]  [facility] [place]  [address], [place] (336) 641-7733 Accepts children up to age 21 who are enrolled in Medicaid or [place] Health Choice; pregnant women with a Medicaid card; and children who have applied for Medicaid or [place] Health Choice, but were declined, whose parents can pay a reduced fee at time of service.  °[facility] ° [address], [place] (336) 641-4533 Patients are seen by appointment only. Walk-ins are not accepted. [facility] will see patients 18 years of age and older. °Monday - Tuesday (8am-5pm) °Most Wednesdays (8:30-5pm) °$30 per visit, cash only  °[facility] ° [address], [place] (336) 641-4533 Patients are seen by appointment only. Walk-ins are not accepted. [facility] will see patients 18 years of age and older. °One   Wednesday Evening (Monthly: Volunteer Based).  $30 per visit, cash only  °[facility]  (919) 537-3737 for adults; Children under age 4, call Graduate Pediatric Dentistry at (919) [phone]. Children aged 4-14, please call (919) 537-3737 to request a pediatric application. ° Dental services are provided in all areas of dental care including fillings, crowns and bridges, complete and partial dentures, implants, gum treatment, root canals, and extractions. Preventive care is also provided. Treatment is provided to both adults and children. °Patients are selected via a lottery and there is often a waiting list. °  °[facility] [address], °[place] ° (336) 763-8833 [email or web address] °  °[facility] [address], [place], [place] (336)723-1848, Ext. 123 Second and Fourth Thursday of each month, opens at 6:30 AM; Clinic ends at 9 AM.  Patients are seen on a first-come first-served basis, and a limited number are seen during each clinic.  ° °[facility] ° [address], [place], [place] (336) 723-7904    Eligibility Requirements °You must have lived in [place], [place], or [place] counties for at least the last three months. °  You cannot be eligible for state or federal sponsored healthcare insurance, including Veterans Administration, Medicaid, or Medicare. °  You generally cannot be eligible for healthcare insurance through your employer.  °  How to apply: °Eligibility screenings are held every Tuesday and Wednesday afternoon from 1:00 pm until 4:00 pm. You do not need an appointment for the interview!  °[facility] [address], [place], [place] 336-631-2330   °[facility] [facility]  336-342-8273   °[facility]  336-703-3100   °[facility]  336-570-6415   ° °Behavioral Health Resources in the Community: °Intensive Outpatient Programs °Organization         Address  Phone  Notes  °[facility] [address], [place], [place] 336-878-6098   °[facility] [address], [place], [place] 336-832-9800   °[facility] [address], [place], [place] ° 336-882-2125   °[facility] [address],  °[place], [place] 1-800-853-5163 or 336-641-4981   °Substance Abuse Resources °Organization         Address  Phone  Notes  °[facility]  336-882-2125   °[facility]  336-784-9470   °[facility]  336-285-9073   °[facility]  336-845-3988   °Residential & Outpatient Substance Abuse Program  1-800-659-3381   °Psychological Services °Organization         Address  Phone  Notes  °[facility]  336- 832-9600   °[facility]  336- 378-7881   °[facility] [address], [place] 1-800-853-5163 or 336-641-4981   ° °Mobile Crisis Teams °Organization         Address  Phone  Notes  °[facility], Mobile Crisis Care Unit  1-877-626-1772   °Assertive °[facility] ° [address]  [place], [place] 336-834-9664   °Sharon DeEsch [address] ° [place] 336-554-5454   ° °Self-Help/Support Groups °Organization         Address  Phone             Notes  °[facility]  - variety of support groups  336- 373-1402 Call for more information  °Narcotics Anonymous (NA), [facility] [address], °[place]  2 meetings at this location  ° °  Residential Treatment Programs °Organization         Address  Phone  Notes  °[facility] [address],    °[place]  1-866-801-8205   °[facility] ° [address], [place], [place] 704-293-8524   °[facility] [address], [place] 336-845-3988 Admissions: 8am-3pm M-F  °[facility] [address]    °[place], [place] 336-841-1104   °[facility] [address], [place], [place] 336-379-7146   °[facility] [address]  °[place], [place] 336-285-9073   °[facility] Intensive Outpatient [address], [place], [place] 336-852-3033   °[facility] [address]  °[place], [place] 1-877-615-2722 or 336-784-9470   °[facility] [address] [place], [place] 336-227-7417 Accepts Medicaid  °[facility] [address]  °[place] 1-800-659-3381 Substance Abuse/Addiction Treatment  ° °[place] Behavioral Health Resources °Organization         Address  Phone  Notes  °[facility]  (888) 581-9988   °Julie Brannon, PhD [address] [place], [place]   (336) 349-5553 or (336) 951-0000   °[facility]   [address] °[place], [place] (336) 349-4454   °[facility] [address], [place], [place] (336) 342-8316 Insurance/Medicaid/sponsorship through [facility]  °[facility] [address]                                    [place], [place] (336) 342-8316 Therapy/tele-psych/case    °[facility] [address]  ° [place], [place] (336) 349-2233    °Dr. Arfeen  (336) 349-4544   °[facility] [place]  [employer] [place] [facility] 1) [address], [place] °2) [address], [place] °3)  [address], [place] (336) 349-3220 °(336) 342-7768 ° °(336) 342-8140   °[place] [facility] (336) 342-1394 or (336) 342-3537 (After Hours)    ° ° °

## 2013-08-13 NOTE — ED Notes (Signed)
Patient is alert and oriented x3.  She is complaining of gum pain that started last night about 10pm. Currently she states that her pain is 10 of 10.  She adds that she has had some scant bleeding in the  Gums.

## 2013-08-13 NOTE — ED Provider Notes (Signed)
CSN: [id number]     Arrival date & time 08/13/13  0151 History   First MD Initiated Contact with Patient 08/13/13 0230     Chief Complaint  Patient presents with  . Dental Pain     (Consider location/radiation/quality/duration/timing/severity/associated sxs/prior Treatment) HPI Comments: Dental gum pain ocaisonal bleeding noted, also reports bilateral ear discomfort, and chronic low back pain. Currently homeless and carrying all belonging in several bags and suitcases   Patient is a 58 y.o. female presenting with tooth pain. The history is provided by the patient.  Dental Pain Location:  Lower Lower teeth location: gums lower. Quality:  Dull and constant Severity:  Mild Onset quality:  Unable to specify Timing:  Constant Progression:  Unchanged Chronicity:  Chronic Context: dental caries and poor dentition   Relieved by:  Nothing Worsened by:  Nothing tried Ineffective treatments:  None tried Associated symptoms: oral bleeding   Associated symptoms: no difficulty swallowing, no facial swelling, no fever and no gum swelling     Past Medical History  Diagnosis Date  . Asthma   . Hypertension   . Diabetes mellitus    Past Surgical History  Procedure Laterality Date  . Abdominal hysterectomy    . Breast cyst excision    . Coronary angioplasty with stent placement      Entirely unclear. [place] and cannot give any accurate details of this   Family History  Problem Relation Age of Onset  . Leukemia Father    History  Substance Use Topics  . Smoking status: Current Some Day Smoker -- 5.00 packs/day for 5 years    Types: Cigarettes  . Smokeless tobacco: Not on file  . Alcohol Use: Yes     Comment: beer sometimes   OB History   Grav Para Term Preterm Abortions TAB SAB Ect Mult Living                 Review of Systems  Constitutional: Negative for fever.  HENT: Positive for dental problem. Negative for facial swelling.   Musculoskeletal:  Positive for back pain.  All other systems reviewed and are negative.     Allergies  Codeine  Home Medications   Prior to Admission medications   Medication Sig Start Date End Date Taking? Authorizing Provider  HYDROcodone-acetaminophen (NORCO/VICODIN) 5-325 MG per tablet Take 1 tablet by mouth every 6 (six) hours as needed for moderate pain. 04/12/13   Jamesetta Orleanshristopher W Lawyer, PA-C  ibuprofen (ADVIL,MOTRIN) 800 MG tablet Take 1 tablet (800 mg total) by mouth every 8 (eight) hours as needed. 04/12/13   Jamesetta Orleanshristopher W Lawyer, PA-C   BP 182/76  Pulse 89  Temp(Src) 98.3 F (36.8 C) (Oral)  Resp 18  SpO2 96% Physical Exam  Nursing note and vitals reviewed. Constitutional: She appears well-developed and well-nourished.  HENT:  Head: Normocephalic.  Extensive dental decay no bleeding noted  Eyes: Pupils are equal, round, and reactive to light.  Neck: Normal range of motion.  Cardiovascular: Normal rate.   Pulmonary/Chest: Effort normal.  Musculoskeletal: Normal range of motion. She exhibits no edema and no tenderness.  Lymphadenopathy:    She has no cervical adenopathy.  Neurological: She is alert.  Skin: Skin is warm and dry.    ED Course  Procedures (including critical care time) Labs Review Labs Reviewed - No data to display  Imaging Review No results found.   EKG Interpretation None      MDM  Extensive dental decay  Will  refer to resource list for dental care  Final diagnoses:  Dental decay  Chronic low back pain        Arman FilterGail K Kavya Haag, NP 08/13/13 [phone]

## 2013-08-13 NOTE — ED Provider Notes (Signed)
Medical screening examination/treatment/procedure(s) were performed by non-physician practitioner and as supervising physician I was immediately available for consultation/collaboration.   EKG Interpretation None        Enid SkeensJoshua M Kalyb Pemble, MD 08/13/13 [phone]

## 2014-05-01 ENCOUNTER — Encounter (HOSPITAL_COMMUNITY): Payer: Self-pay | Admitting: *Deleted

## 2014-05-01 ENCOUNTER — Emergency Department (HOSPITAL_COMMUNITY)
Admission: EM | Admit: 2014-05-01 | Discharge: 2014-05-01 | Disposition: A | Discharge status: Home / Self Care | Payer: Medicare Other | Attending: Emergency Medicine | Admitting: Emergency Medicine

## 2014-05-01 ENCOUNTER — Emergency Department (HOSPITAL_COMMUNITY): Payer: Medicare Other

## 2014-05-01 DIAGNOSIS — R0789 Other chest pain: Secondary | ICD-10-CM

## 2014-05-01 DIAGNOSIS — Z8659 Personal history of other mental and behavioral disorders: Secondary | ICD-10-CM | POA: Insufficient documentation

## 2014-05-01 DIAGNOSIS — R110 Nausea: Secondary | ICD-10-CM | POA: Diagnosis not present

## 2014-05-01 DIAGNOSIS — Z720 Tobacco use: Secondary | ICD-10-CM | POA: Insufficient documentation

## 2014-05-01 DIAGNOSIS — E119 Type 2 diabetes mellitus without complications: Secondary | ICD-10-CM | POA: Insufficient documentation

## 2014-05-01 DIAGNOSIS — R6883 Chills (without fever): Secondary | ICD-10-CM | POA: Insufficient documentation

## 2014-05-01 DIAGNOSIS — R109 Unspecified abdominal pain: Secondary | ICD-10-CM | POA: Diagnosis not present

## 2014-05-01 DIAGNOSIS — Z590 Homelessness: Secondary | ICD-10-CM | POA: Insufficient documentation

## 2014-05-01 DIAGNOSIS — I10 Essential (primary) hypertension: Secondary | ICD-10-CM | POA: Diagnosis not present

## 2014-05-01 DIAGNOSIS — J45901 Unspecified asthma with (acute) exacerbation: Secondary | ICD-10-CM | POA: Insufficient documentation

## 2014-05-01 DIAGNOSIS — R079 Chest pain, unspecified: Secondary | ICD-10-CM | POA: Diagnosis present

## 2014-05-01 DIAGNOSIS — Z955 Presence of coronary angioplasty implant and graft: Secondary | ICD-10-CM | POA: Diagnosis not present

## 2014-05-01 HISTORY — DX: Anxiety disorder, unspecified: F41.9

## 2014-05-01 LAB — CBC
HCT: 38.6 % (ref 36.0–46.0)
HEMOGLOBIN: 12.8 g/dL (ref 12.0–15.0)
MCH: 27.2 pg (ref 26.0–34.0)
MCHC: 33.2 g/dL (ref 30.0–36.0)
MCV: 82.1 fL (ref 78.0–100.0)
Platelets: 294 10*3/uL (ref 150–400)
RBC: 4.7 MIL/uL (ref 3.87–5.11)
RDW: 14.1 % (ref 11.5–15.5)
WBC: 5.6 10*3/uL (ref 4.0–10.5)

## 2014-05-01 LAB — BASIC METABOLIC PANEL
ANION GAP: 11 (ref 5–15)
BUN: 12 mg/dL (ref 6–23)
CALCIUM: 9.5 mg/dL (ref 8.4–10.5)
CO2: 23 mmol/L (ref 19–32)
CREATININE: 1.19 mg/dL — AB (ref 0.50–1.10)
Chloride: 103 mmol/L (ref 96–112)
GFR calc non Af Amer: 49 mL/min — ABNORMAL LOW (ref >90)
GFR, EST AFRICAN AMERICAN: 56 mL/min — AB (ref >90)
Glucose, Bld: 318 mg/dL — ABNORMAL HIGH (ref 70–99)
POTASSIUM: 4.2 mmol/L (ref 3.5–5.1)
Sodium: 137 mmol/L (ref 135–145)

## 2014-05-01 LAB — I-STAT TROPONIN, ED
TROPONIN I, POC: 0 ng/mL (ref 0.00–0.08)
TROPONIN I, POC: 0.01 ng/mL (ref 0.00–0.08)

## 2014-05-01 LAB — BRAIN NATRIURETIC PEPTIDE: B NATRIURETIC PEPTIDE 5: 26.3 pg/mL (ref 0.0–100.0)

## 2014-05-01 MED ORDER — ALBUTEROL SULFATE (2.5 MG/3ML) 0.083% IN NEBU
5.0000 mg | INHALATION_SOLUTION | Freq: Once | RESPIRATORY_TRACT | Status: AC
  Administered 2014-05-01: 5 mg via RESPIRATORY_TRACT
  Filled 2014-05-01: qty 6

## 2014-05-01 MED ORDER — FENTANYL CITRATE 0.05 MG/ML IJ SOLN
50.0000 ug | Freq: Once | INTRAMUSCULAR | Status: AC
  Administered 2014-05-01: 50 ug via INTRAVENOUS
  Filled 2014-05-01: qty 2

## 2014-05-01 MED ORDER — IPRATROPIUM BROMIDE 0.02 % IN SOLN
0.5000 mg | Freq: Once | RESPIRATORY_TRACT | Status: AC
  Administered 2014-05-01: 0.5 mg via RESPIRATORY_TRACT
  Filled 2014-05-01: qty 2.5

## 2014-05-01 MED ORDER — ONDANSETRON HCL 4 MG/2ML IJ SOLN
4.0000 mg | Freq: Once | INTRAMUSCULAR | Status: AC
  Administered 2014-05-01: 4 mg via INTRAVENOUS
  Filled 2014-05-01: qty 2

## 2014-05-01 MED ORDER — SODIUM CHLORIDE 0.9 % IV BOLUS (SEPSIS)
1000.0000 mL | INTRAVENOUS | Status: AC
  Administered 2014-05-01: 1000 mL via INTRAVENOUS

## 2014-05-01 NOTE — ED Notes (Signed)
Pt. Ate 100% of dinner. 

## 2014-05-01 NOTE — ED Notes (Signed)
Reports left side chest pain since yesterday, having sob and recent cough. No acute distress noted at triage.

## 2014-05-01 NOTE — Progress Notes (Signed)
  CARE MANAGEMENT ED NOTE 05/01/2014  Patient:  Vanessa Hull, Vanessa Hull   Account Number:  [id number]  Date Initiated:  05/01/2014  Documentation initiated by:  [facility]  Subjective/Objective Assessment:   Patient presented to [facility] ED with CP     Subjective/Objective Assessment Detail:   Patieint is homeless, and does not have a PCP     Action/Plan:   CSW consulted for homeless resouces  PCP referral   Action/Plan Detail:   Anticipated DC Date:  05/01/2014     Status Recommendation to Physician:   Result of Recommendation:  Agreed  Other ED Services  Consult Working Plan   In-house referral  Clinical Social Worker   [place]  CM consult  PCP issues  Follow-up appt scheduled    Choice offered to / List presented to:  C-1 Patient          Status of service:  Completed, signed off  ED Comments:   ED Comments Detail:  ED CM consulted with patient regarding follow up care. Met with patient she reports being homeless, and does not have a PCP to follow up with. Offerd assistance with finding a PCP, patient is agreeable. Discussed the [facility] and services rendered, patient is agreeable with establishing care at the [facility]. Scheduled walk-in appt. 1/27 at 10a. Patient given resources for clinic and [facility] homeless shelter. Patient is requesting to speak with CSW. SW consult placed. No further CM needs identified

## 2014-05-01 NOTE — Discharge Instructions (Signed)
Please follow the directions provided.  Be sure to establish care with the [facility] and [facility] or use the resource guide to find a primary care doctor to make sure you are getting better.  They can help make sure your medicines are working and help keep you as healthy as possible.  Don't hesitate to return for any new, worsening or concerning symptoms.    SEEK IMMEDIATE MEDICAL CARE IF:  Your pain increases, or you are very uncomfortable.  You have a fever.  Your chest pain becomes worse.  You have new, unexplained symptoms.  You have nausea or vomiting.  You feel sweaty or lightheaded.  You have a cough with phlegm (sputum), or you cough up blood.   Emergency Department Resource Guide 1) Find a Doctor and Pay Out of Pocket Although you won't have to find out who is covered by your insurance plan, it is a good idea to ask around and get recommendations. You will then need to call the office and see if the doctor you have chosen will accept you as a new patient and what types of options they offer for patients who are self-pay. Some doctors offer discounts or will set up payment plans for their patients who do not have insurance, but you will need to ask so you aren't surprised when you get to your appointment.  2) Contact Your Local Health Department Not all health departments have doctors that can see patients for sick visits, but many do, so it is worth a call to see if yours does. If you don't know where your local health department is, you can check in your phone book. The CDC also has a tool to help you locate your state's health department, and many state websites also have listings of all of their local health departments.  3) Find a Walk-in Clinic If your illness is not likely to be very severe or complicated, you may want to try a walk in clinic. These are popping up all over the country in pharmacies, drugstores, and shopping centers. They're usually staffed by nurse  practitioners or physician assistants that have been trained to treat common illnesses and complaints. They're usually fairly quick and inexpensive. However, if you have serious medical issues or chronic medical problems, these are probably not your best option.  No Primary Care Doctor: - Call Health Connect at  [phone] - they can help you locate a primary care doctor that  accepts your insurance, provides certain services, etc. - Physician Referral Service- [phone]  Chronic Pain Problems: Organization         Address  Phone   Notes  [facility]  [phone]) [phone] Patients need to be referred by their primary care doctor.   Medication Assistance: Organization         Address  Phone   Notes  [facility] [address] [place], [place] [phone]) [phone] --Must be a resident of [facility] [place] -- Must have NO insurance coverage whatsoever (no Medicaid/ Medicare, etc.) -- The pt. MUST have a primary care doctor that directs their care regularly and follows them in the community   MedAssist  [phone]   [employer]  [phone]    Agencies that provide inexpensive medical care: Organization         Address  Phone   Notes  [facility]  [phone]) [phone]   [facility]    (  [phone]   [facility] [place], [place] [address] [phone]   [place] [address] [place] [phone]   Planned Parenthood    [phone]   [facility]    [phone]   [place] and [place] [address], [place] Phone:  [phone], Fax:  [phone] Hours of Operation:  9 am - 6 pm, M-F.  Also accepts Medicaid/Medicare and self-pay.  [facility] for [place], [address], [place] Phone: [phone], Fax: [phone]. Hours of Operation:  8:30 am -  5:30 pm, M-F.  Also accepts Medicaid and self-pay.  [facility] [place] [address], [place] Phone: [phone]   [place], [place], [place] [phone], Ext. [phone] Mondays & Thursdays: 7-9 AM.  First 15 patients are seen on a first come, first serve basis.    [place] Providers:  Organization         Address  Phone   Notes  [facility] [address], [place] [phone] Also accepts self-pay patients.  [facility] [address], [place]  [phone]   [place], [address], [place] [phone]   [facility] [address] [place] [phone]   Lucianne Lei [address], [place]   [phone] Only accepts [place] Access [place] patients after they have their name applied to their card.   Self-Pay (no insurance) in [facility]:  Organization         Address  Phone   Notes  Sickle Cell Patients, [facility] [place] [phone]   [facility] [place] [phone]   [facility] [place]  [place], [place], [place] [phone]   [facility]/Dr. Osei-Bonsu  [address] [place] or [address], [place] [phone] Phone number for both [place] and [place] locations is the same.  [facility]gent [facility] and [facility] [address], [place] [phone]   [facility] [address] [place] or [address] [phone]   [facility] [address], [place] [phone], phone; [phone], fax Sees patients 1st and 3rd Saturday of every month.  Must not qualify for public or private insurance (i.e. Medicaid, Medicare, [place] Health Choice,  Veterans' Benefits)  Household income should be no more than 200% of the poverty level The clinic cannot treat you if you are pregnant or think you are pregnant  Sexually transmitted diseases are not treated at the clinic.    Dental Care: Organization         Address  Phone  Notes  [facility] Department of [facility] [place] [phone] Accepts children up to age 52 who are enrolled in [place] or [place]; pregnant women with a Medicaid card; and children who have applied for Medicaid or [place] Health Choice, but were declined, whose parents can pay a reduced fee at time of service.  [facility] Department of [facility]  [address], [place] [phone] Accepts children up  to age 60 who are enrolled in Medicaid or [place] Health Choice; pregnant women with a Medicaid card; and children who have applied for Medicaid or [place] Health Choice, but were declined, whose parents can pay a reduced fee at time of service.  [place] Adult Dental Access PROGRAM  [place] [phone] Patients are seen by appointment only. Walk-ins are not accepted. [place] will see patients 9 years of age and older. Monday - Tuesday (8am-5pm) Most Wednesdays (8:30-5pm) $30 per visit, cash only  [facility] Adult Dental Access PROGRAM  [address], [facility] [phone] Patients are seen by appointment only. Walk-ins are not accepted. [place] will see patients 37 years of age and older. One Wednesday Evening (Monthly: Volunteer Based).  $30 per visit, cash only  [place]  [phone] for adults; Children under age 16, call Graduate Pediatric Dentistry at [phone]. Children aged 86-14, please call [phone] to request a pediatric application.  Dental services are provided in all areas of dental care including fillings, crowns and bridges, complete and  partial dentures, implants, gum treatment, root canals, and extractions. Preventive care is also provided. Treatment is provided to both adults and children. Patients are selected via a lottery and there is often a waiting list.   [facility] [address] [place]  [phone] [email or web address]   [facility] [address] [place], [place] [phone], Ext. [phone] Second and Fourth Thursday of each month, opens at 6:30 AM; Clinic ends at 9 AM.  Patients are seen on a first-come first-served basis, and a limited number are seen during each clinic.   [facility]  [address] Hillard Danker [place], [place] [phone]   Eligibility Requirements You must have lived in [place], [place], or [place] counties for at least the last three months.   You cannot be eligible for state or federal sponsored [employer], including [employer], [place], or [employer].   You generally cannot be eligible for healthcare insurance through your employer.    How to apply: Eligibility screenings are held every Tuesday and Wednesday afternoon from 1:00 pm until 4:00 pm. You do not need an appointment for the interview!  [facility] [address] [place], [place]   [place]  [place] Department  [place]  [phone]    Behavioral Health Resources in the Community: Intensive Outpatient Programs Organization         Address  Phone  Notes  [place]. [address] [place], [place] [phone]   [facility] [address], [place], [place]   ADS: [facility] [address] [place], [place]   [place] [address],  [place], [place] or [phone]   Substance Abuse Resources Organization          Address  Phone  Notes  Alcohol and Drug Services  [phone]   [place]  [phone]   The Gay   Chinita Pester  [phone]   Residential & Outpatient Substance Abuse Program  [phone]   Psychological Services Organization         Address  Phone  Notes  [place]  [place]  [place]   [place]  [address], [place] [phone] or [phone]    Mobile Crisis Teams Organization         Address  Phone  Notes  [facility], Mobile Crisis Care Unit  [phone]   [facility]  [address] [place], [place] [phone]   Doristine Locks [address] [place] [phone]    Self-Help/Support Groups Organization         Address  Phone             Notes  Mental Health Assoc. of [place] - variety of support groups  [phone]- [phone] Call for more information  Narcotics Anonymous (NA), [facility] [address], [employer] [place]  2 meetings at this location   Statistician         Address  Phone  Notes  [facility] [address],    [place]  [phone]   [facility]  [address] [place] [address], [place], [place] [phone]   [facility] [address] [place], [place] [phone] Admissions: 8am-3pm M-F  [facility] [address],    [place], [place] [phone]   [facility] [address] [place], [place], [place] [phone]   [facility] [address]  [place], [place] [phone]   [facility] - Intensive Outpatient [address], [place], [place] [phone]   [facility] ([facility] [address] [place].,  [place], [place] [phone] or [phone]   [facility] Services (RTS) [address] [place], [place]  [phone] Accepts Medicaid  [facility] [address],  [place] [phone] Substance Abuse/Addiction Treatment   [facility] Organization         Address  Phone  Notes  [facility]  [phone]   Angie Fava, PhD [address] Ervin Knack [place], [place]   [phone]) [phone] or [phone]   [facility] Behavioral   [address] [place], [place] [phone]   [facility] [address] [place], [place] [phone] Insurance/Medicaid/sponsorship through [facility] and Families [address]                                    [place], [place] [phone] Therapy/tele-psych/case  [facility] [address], [place] [phone]    Dr. Lolly Mustache  [phone]   [facility] [place]  United Way [facility] Dept. 1) [address] [place] 2) [address], [place] 3)  [address], [place] [phone]  [phone]   [facility] Child Abuse Hotline [phone]) [phone] or [phone] (After Hours)

## 2014-05-01 NOTE — ED Provider Notes (Signed)
CSN: [id number]     Arrival date & time 05/01/14  1124 History   First MD Initiated Contact with Patient 05/01/14 1614     Chief Complaint  Patient presents with  . Chest Pain  . Shortness of Breath   (Consider location/radiation/quality/duration/timing/severity/associated sxs/prior Treatment) HPI  Vanessa Hull is a 59 yo female presenting with report of persistent intermittent chest and abd pain.  She's not sure when these symptoms began but they are bothering her today and she reports "not feeling good".  She is frustrated that she has been waiting a long time and would like to know the results of her blood work, a bus pass and to see Child psychotherapistsocial worker and leave.  Discussed importance of examining pt and she agreed. She endorses a cough with white sputum. She endorses nausea but no vomiting or diarrhea.  She endorse some chills, but no documented fevers.  She denies diaphoresis or exertional chest pain in any way.  Past Medical History  Diagnosis Date  . Asthma   . Hypertension   . Diabetes mellitus   . Anxiety    Past Surgical History  Procedure Laterality Date  . Abdominal hysterectomy    . Breast cyst excision    . Coronary angioplasty with stent placement      Entirely unclear. [place] in [place] and cannot give any accurate details of this   Family History  Problem Relation Age of Onset  . Leukemia Father    History  Substance Use Topics  . Smoking status: Current Some Day Smoker -- 5.00 packs/day for 5 years    Types: Cigarettes  . Smokeless tobacco: Not on file  . Alcohol Use: Yes     Comment: beer sometimes   OB History    No data available     Review of Systems  Constitutional: Negative for fever and chills.  HENT: Negative for sore throat.   Eyes: Negative for visual disturbance.  Respiratory: Positive for cough. Negative for shortness of breath.   Cardiovascular: Positive for chest pain. Negative for leg swelling.  Gastrointestinal: Positive for  nausea and abdominal pain. Negative for vomiting and diarrhea.  Genitourinary: Negative for dysuria.  Musculoskeletal: Negative for myalgias.  Skin: Negative for rash.  Neurological: Negative for weakness, numbness and headaches.      Allergies  Codeine  Home Medications   Prior to Admission medications   Medication Sig Start Date End Date Taking? Authorizing Provider  HYDROcodone-acetaminophen (NORCO/VICODIN) 5-325 MG per tablet Take 1 tablet by mouth every 6 (six) hours as needed for moderate pain. 04/12/13   Jamesetta Orleanshristopher W Lawyer, PA-C  ibuprofen (ADVIL,MOTRIN) 600 MG tablet Take 1 tablet (600 mg total) by mouth every 6 (six) hours as needed. 08/13/13   Arman FilterGail K Schulz, NP  ibuprofen (ADVIL,MOTRIN) 800 MG tablet Take 1 tablet (800 mg total) by mouth every 8 (eight) hours as needed. 04/12/13   Jamesetta Orleanshristopher W Lawyer, PA-C   BP 178/100 mmHg  Pulse 93  Temp(Src) 98.7 F (37.1 C) (Oral)  Resp 18  Ht 5\' 6"  (1.676 m)  Wt 180 lb (81.647 kg)  BMI 29.07 kg/m2  SpO2 100% Physical Exam  Constitutional: She is oriented to person, place, and time. She appears well-developed and well-nourished. No distress.  HENT:  Head: Normocephalic and atraumatic.  Mouth/Throat: Oropharynx is clear and moist. No oropharyngeal exudate.  Eyes: Conjunctivae are normal. Pupils are equal, round, and reactive to light.  Neck: Neck supple. No thyromegaly present.  Cardiovascular: Normal  rate, regular rhythm, S1 normal, S2 normal and intact distal pulses.  Exam reveals no gallop and no friction rub.   No murmur heard. Pulmonary/Chest: Effort normal and breath sounds normal. No respiratory distress. She has no decreased breath sounds. She has no wheezes. She has no rhonchi. She has no rales. She exhibits tenderness.    Abdominal: Soft. There is no tenderness. There is no rigidity, no rebound, no guarding, no CVA tenderness, no tenderness at McBurney's point and negative Murphy's sign.  Musculoskeletal: She exhibits no  tenderness.  Lymphadenopathy:    She has no cervical adenopathy.  Neurological: She is alert and oriented to person, place, and time. No cranial nerve deficit.  Skin: Skin is warm and dry. No rash noted. She is not diaphoretic.  Psychiatric: She has a normal mood and affect.  Nursing note and vitals reviewed.   ED Course  Procedures (including critical care time) Labs Review Labs Reviewed  BASIC METABOLIC PANEL - Abnormal; Notable for the following:    Glucose, Bld 318 (*)    Creatinine, Ser 1.19 (*)    GFR calc non Af Amer 49 (*)    GFR calc Af Amer 56 (*)    All other components within normal limits  CBC  BRAIN NATRIURETIC PEPTIDE  I-STAT TROPOININ, ED  I-STAT TROPOININ, ED    Imaging Review Dg Chest 2 View  05/01/2014   CLINICAL DATA:  Chest pain, dizziness, shortness of breath, abdominal pain, symptoms since yesterday, history asthma, pneumonia, hypertension, diabetes, smoker  EXAM: CHEST  2 VIEW  COMPARISON:  04/12/2013  FINDINGS: Upper normal heart size.  Atherosclerotic calcification aorta.  Mediastinal contours and pulmonary vascularity normal.  Lungs grossly clear.  No pleural effusion or pneumothorax.  No acute osseous findings.  Chronic compression deformity of a vertebra at the thoracolumbar junction appears unchanged.  IMPRESSION: No acute abnormalities.   Electronically Signed   By: Ulyses Southward M.D.   On: 05/01/2014 12:26     EKG Interpretation   Date/Time:  Tuesday May 01 2014 11:35:06 EST Ventricular Rate:  99 PR Interval:  142 QRS Duration: 68 QT Interval:  352 QTC Calculation: 451 R Axis:   16 Text Interpretation:  Normal sinus rhythm Moderate voltage criteria for  LVH, may be normal variant No significant change since last tracing  Confirmed by HARRISON  MD, FORREST ([id number]) on 05/01/2014 4:39:15 PM      MDM   Final diagnoses:  Chest wall pain   59 yo pt with multiple complaints and multiple concerns related to her homelessness. She spent the  majority of the visit requesting cab vouchers and assistance with housing.  Her symptoms are not new but her most bothersome symptom is general chest pains. Discussed case with Dr. Romeo Apple.  CBC, BMP, BNP, Troponin, CXR and EKG done from triage show elevated glucose at 318 but no anion gap.  Pt treated with NS bolus and zofran and neb treatment for symptom management.  Pt provided meal and Care management consulted and discussed resources with pt. Pt's symptoms improved after treatment and plan to discharge. However, pt reports blurred vision prior to discharge. On further investigation, vision improved after eye make-up wiped from eyes.  Pt is well-appearing, in no acute distress and vital signs reviewed and not concerning. She appears safe to be discharged.  Discharge include resources to follow-up with [facility]. Return precautions provided.      Filed Vitals:   05/01/14 1830 05/01/14 1845 05/01/14 1915 05/01/14 1916  BP: 188/78 183/67 171/59   Pulse: 95 94 101   Temp:      TempSrc:      Resp: Height:      Weight:      SpO2: 99% 99% 97% 99%   Meds given in ED:  Medications  sodium chloride 0.9 % bolus 1,000 mL (0 mLs Intravenous Stopped 05/01/14 1839)  ondansetron (ZOFRAN) injection 4 mg (4 mg Intravenous Given 05/01/14 1654)  albuterol (PROVENTIL) (2.5 MG/3ML) 0.083% nebulizer solution 5 mg (5 mg Nebulization Given 05/01/14 1702)  ipratropium (ATROVENT) nebulizer solution 0.5 mg (0.5 mg Nebulization Given 05/01/14 1702)  fentaNYL (SUBLIMAZE) injection 50 mcg (50 mcg Intravenous Given 05/01/14 1701)    Discharge Medication List as of 05/01/2014  6:23 PM         Harle Battiest, NP 05/03/14 7829  Purvis Sheffield, MD 05/03/14 1203

## 2014-05-01 NOTE — ED Notes (Signed)
Ordered regular tray. Spoke with Dana  

## 2014-05-01 NOTE — ED Notes (Signed)
Went into Discharge pt. And she stated, "My eyes are blurry."   When asked when it started , she stated, "It started a while ago."  Reported to Dr. Romeo AppleHarrison.

## 2014-05-01 NOTE — ED Notes (Signed)
Pt a/o x 4 on d/c in wheelchair. Pt given taxi voucher.

## 2019-09-05 DEATH — deceased
